# Patient Record
Sex: Male | Born: 1940 | Race: White | Hispanic: No | Marital: Married | Smoking: Never smoker
Health system: Southern US, Community
[De-identification: ages and names within clinical notes are randomized; demographics above are authoritative.]

## PROBLEM LIST (undated history)

## (undated) DIAGNOSIS — E785 Hyperlipidemia, unspecified: Secondary | ICD-10-CM

## (undated) DIAGNOSIS — E669 Obesity, unspecified: Secondary | ICD-10-CM

## (undated) DIAGNOSIS — I2510 Atherosclerotic heart disease of native coronary artery without angina pectoris: Secondary | ICD-10-CM

## (undated) DIAGNOSIS — Z8739 Personal history of other diseases of the musculoskeletal system and connective tissue: Secondary | ICD-10-CM

## (undated) DIAGNOSIS — M4800 Spinal stenosis, site unspecified: Secondary | ICD-10-CM

## (undated) DIAGNOSIS — M722 Plantar fascial fibromatosis: Secondary | ICD-10-CM

## (undated) HISTORY — DX: Atherosclerotic heart disease of native coronary artery without angina pectoris: I25.10

## (undated) HISTORY — DX: Hyperlipidemia, unspecified: E78.5

## (undated) HISTORY — DX: Obesity, unspecified: E66.9

## (undated) HISTORY — DX: Personal history of other diseases of the musculoskeletal system and connective tissue: Z87.39

## (undated) HISTORY — DX: Spinal stenosis, site unspecified: M48.00

## (undated) HISTORY — PX: TONSILLECTOMY: SHX5217

## (undated) HISTORY — DX: Plantar fascial fibromatosis: M72.2

---

## 1997-02-14 HISTORY — PX: CORONARY ARTERY BYPASS GRAFT: SHX141

## 1997-08-14 ENCOUNTER — Ambulatory Visit (HOSPITAL_COMMUNITY): Admission: RE | Admit: 1997-08-14 | Discharge: 1997-08-14 | Payer: Self-pay | Admitting: Cardiology

## 1997-08-25 ENCOUNTER — Inpatient Hospital Stay (HOSPITAL_COMMUNITY): Admission: RE | Admit: 1997-08-25 | Discharge: 1997-08-30 | Payer: Self-pay | Admitting: Surgery

## 1997-09-30 ENCOUNTER — Encounter (HOSPITAL_COMMUNITY): Admission: RE | Admit: 1997-09-30 | Discharge: 1997-12-29 | Payer: Self-pay | Admitting: Cardiology

## 1997-12-30 ENCOUNTER — Encounter (HOSPITAL_COMMUNITY): Admission: RE | Admit: 1997-12-30 | Discharge: 1998-03-30 | Payer: Self-pay | Admitting: Cardiology

## 2004-08-02 ENCOUNTER — Inpatient Hospital Stay (HOSPITAL_BASED_OUTPATIENT_CLINIC_OR_DEPARTMENT_OTHER): Admission: RE | Admit: 2004-08-02 | Discharge: 2004-08-02 | Payer: Self-pay | Admitting: Cardiology

## 2004-08-02 HISTORY — PX: CARDIAC CATHETERIZATION: SHX172

## 2007-03-13 ENCOUNTER — Encounter: Admission: RE | Admit: 2007-03-13 | Discharge: 2007-03-13 | Payer: Self-pay | Admitting: Orthopedic Surgery

## 2007-04-30 ENCOUNTER — Ambulatory Visit (HOSPITAL_COMMUNITY): Admission: RE | Admit: 2007-04-30 | Discharge: 2007-04-30 | Payer: Self-pay | Admitting: Orthopedic Surgery

## 2010-04-29 ENCOUNTER — Ambulatory Visit (INDEPENDENT_AMBULATORY_CARE_PROVIDER_SITE_OTHER): Payer: MEDICARE | Admitting: Cardiology

## 2010-04-29 DIAGNOSIS — I2510 Atherosclerotic heart disease of native coronary artery without angina pectoris: Secondary | ICD-10-CM

## 2010-04-29 DIAGNOSIS — E782 Mixed hyperlipidemia: Secondary | ICD-10-CM

## 2010-04-29 DIAGNOSIS — Z951 Presence of aortocoronary bypass graft: Secondary | ICD-10-CM

## 2010-05-13 ENCOUNTER — Ambulatory Visit (HOSPITAL_COMMUNITY): Payer: MEDICARE | Attending: Cardiology | Admitting: Radiology

## 2010-05-13 VITALS — Ht 69.5 in | Wt 212.0 lb

## 2010-05-13 DIAGNOSIS — I440 Atrioventricular block, first degree: Secondary | ICD-10-CM

## 2010-05-13 DIAGNOSIS — I25810 Atherosclerosis of coronary artery bypass graft(s) without angina pectoris: Secondary | ICD-10-CM

## 2010-05-13 DIAGNOSIS — R0602 Shortness of breath: Secondary | ICD-10-CM

## 2010-05-13 DIAGNOSIS — R079 Chest pain, unspecified: Secondary | ICD-10-CM

## 2010-05-13 DIAGNOSIS — Z951 Presence of aortocoronary bypass graft: Secondary | ICD-10-CM | POA: Insufficient documentation

## 2010-05-13 DIAGNOSIS — I2510 Atherosclerotic heart disease of native coronary artery without angina pectoris: Secondary | ICD-10-CM | POA: Insufficient documentation

## 2010-05-13 MED ORDER — TECHNETIUM TC 99M TETROFOSMIN IV KIT
33.0000 | PACK | Freq: Once | INTRAVENOUS | Status: AC | PRN
  Administered 2010-05-13: 33 via INTRAVENOUS

## 2010-05-13 MED ORDER — TECHNETIUM TC 99M TETROFOSMIN IV KIT
11.0000 | PACK | Freq: Once | INTRAVENOUS | Status: AC | PRN
  Administered 2010-05-13: 11 via INTRAVENOUS

## 2010-05-13 NOTE — Progress Notes (Signed)
[facility] SITE 3 NUCLEAR MED [address] [place] [address] [phone]  Cardiology Nuclear Med Henry Patrick male 09/14/1940   Nuclear Med Background Indication for Stress Test:  Evaluation for Ischemia and Graft Patency History: '99 CABG x 2;'06 Heart Catheterization:Grafts patent,EF60%, residual N/O CAD; and'09 Myocardial Perfusion Study:NL, EF=56% Cardiac Risk Factors: Family History - CAD and Lipids  Symptoms:  Chest Pain with Exertion (last date of chest discomfort yesterday), DOE, Fatigue with Exertion   Nuclear Pre-Procedure Caffeine/Decaff Intake:  7:00pm NPO After: 8:00pm   Lungs:  Clear IV 0.9% NS with Angio Cath:  18g  IV Site: R Antecubital  IV Started by:  Stanton Kidney, EMT-P  Chest Size (in):  42 Cup Size:   NA  Height: 5' 9.5" (1.765 m)  Weight:  212 lb (96.163 kg)  BMI:  Body mass index is 30.86 kg/(m^2). Tech Comments:  NA    Nuclear Med Study 1 or 2 day study: 1 day  Stress Test Type:  Stress  Reading MD: Dietrich Pates, MD  Order Authorizing Provider:  Dr. Vonna Drafts  Resting Radionuclide: Technetium 40m Tetrofosmin  Resting Radionuclide Dose: 11 mCi   Stress Radionuclide:  Technetium 12m Tetrofosmin  Stress Radionuclide Dose: 33 mCi           Stress Protocol Rest HR: 55 Stress HR: 146  Rest BP: 160/84 Stress BP: 214/75  Exercise Time:  7:00 min METS: 8.5  Predicted HR: 149 % of Maximum: 97.99    Predicted Max HR: 149 bpm % Max HR: 97.99 bpm Rate Pressure Product: 56213    Dose of Adenosine:  NA mg Dose of Lexiscan:  NA mg  Dose of Atropine:  NA mg Dose of Dobutamine:  NA mcg/kg/min (at max HR)  Stress Test Technologist: Irean Hong, RN  Nuclear Technologist:  Domenic Polite, CNMT     Rest Procedure:  Myocardial perfusion imaging was performed at rest 45 minutes following the intravenous administration of Technetium 46m Tetrofosmin. Rest ECG: SB, 1st degree heart block with PVC  Stress Procedure:  The  patient exercised for seven minutes, RPE=15. The patient stopped due to DOE and denied any chest pain. There were  ST-T wave changes, and rare PVC,and fusion beat.There was a hypertensive response to exercise.  Technetium 81m Tetrofosmin was injected at peak exercise and myocardial perfusion imaging was performed after a brief delay. Stress ECG: No significant change from baseline ECG  QPS Raw Data Images:  Mild diaphragmatic attenuation; normal left ventricular size. Stress Images:  Normal homogeneous uptake in all areas of the myocardium. Rest Images:  Normal homogeneous uptake in all areas of the myocardium. Subtraction (SDS):  No evidence of ischemia.  Findings Risk Category:  Normal nuclear study. Clinically Abnormal:  No Ischemia:  No Fixed Defect:  No LV Dysfunction:  No Transient Ischemic Dilatation (Normal <1.22):  1.01  Lung/Heart Ratio (Normal <0.45):  .32  Quantitative Gated Spect Images QGS EDV:  98 ml QGS ESV:  39 ml QGS cine images:  Normal wall motion. QGS EF:  60 %  Impression Exercise Capacity:  Fair exercise capacity. BP Response:  Normal blood pressure response. Clinical Symptoms:  No chest pain. ECG Impression:  No significant ST segment change suggestive of ischemia. Comparison with Prior Nuclear Study: Normal compared to report of 2009.  Overall Impression:  Normal stress nuclear study.

## 2010-05-17 ENCOUNTER — Telehealth: Payer: Self-pay | Admitting: *Deleted

## 2010-05-17 NOTE — Telephone Encounter (Signed)
Notified of myoview

## 2010-05-17 NOTE — Telephone Encounter (Signed)
LM w/ STC results

## 2010-05-31 NOTE — Progress Notes (Signed)
COPY ROUTED TO DR.JORDAN.Falecha Clark ° °

## 2010-06-01 ENCOUNTER — Telehealth: Payer: Self-pay | Admitting: *Deleted

## 2010-06-01 NOTE — Telephone Encounter (Signed)
LM w/ nml stress test. Sent to Dr. Eloise Harman

## 2010-06-29 NOTE — Op Note (Signed)
NAME:  Henry Patrick, MCFETRIDGE               ACCOUNT NO.:  [id number]   MEDICAL RECORD NO.:  [id number]          PATIENT TYPE:  AMB   LOCATION:  DAY                          FACILITY:  [facility]   PHYSICIAN:  Marlowe Kays, M.D.  DATE OF BIRTH:  1940-08-22   DATE OF PROCEDURE:  04/30/2007  DATE OF DISCHARGE:                               OPERATIVE REPORT   PREOPERATIVE DIAGNOSIS:  Torn medial meniscus right knee.   POSTOPERATIVE DIAGNOSIS:  1. Torn medial meniscus.  2. 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  3. Chondromalacia of patella.  4. Medial and lateral shelf plica.   OPERATION:  Right knee arthroscopy with:  1. Partial medial meniscectomy.  2. Shaving of medial femoral condyle with removal of loose body.  3. Shaving of patella.  4. Excision of medial and lateral shelf suprapatellar plica.   SURGEON:  Marlowe Kays, MD.   ASSISTANT:  Nurse.   ANESTHESIA:  General.   Because of pain and swelling in his right knee, he is here today.  He  had an  MRI demonstrating the torn medial meniscus.  He is here today,  consequently, for the above-mentioned surgery.   PROCEDURE:  Satisfactory general anesthesia, pneumatic tourniquet  applied, right leg Esmarched out nonsterilely, inflated to 325 mmHg.  Thigh stabilizer to right leg which was prepped with DuraPrep from  stabilizer to ankle and draped in a sterile field.  Ace wrap and knee  support to his left lower extremity, time-out performed.  Superior and  medial saline inflow.  First, through an anterolateral portal in the  medial compartment knee joint was evaluated.  He had a good bit of  synovitis anteromedially which I resected with a 3.5 shaver.  He had  sustained an abrasion injury to the anterior weightbearing surface of  the medial meniscus but no true tear.  The chondromalacia of the medial  femoral condyle was noted including a loose fragment of articular  cartilage in the joint.  I  shaved down the medial femoral condyle until  smooth and in the process removed the loose fragment.  Posteriorly, he  had tears from just proximal to the curve all the way into the  intercondylar area which I trimmed back to a stable rim with a  combination of 3.5 shaver and curved upbite baskets.  Looking at the  medial gutter and suprapatellar area, he had some moderate wear of the  midportion of the patella and medial and lateral plicae.  I sectioned  the plicae with scissors and then resected the remnants with the 3.5  shavers as well as shaving the patella.  I then reversed portals.  There  was a little synovitis at the anterolateral joint which I resected.  The  remainder of the lateral joint looked completely normal.  The knee joint  was irrigated until clear and all fluid possible removed.  The three  entry portals were closed with #4-0 nylon.  I injected 20 mL 0.5%  Marcaine with adrenaline and 4 mg of morphine through the  inflow apparatus  which was removed and this port was closed with #4-0  nylon as well.  Betadine, Adaptic, dry sterile dressing were applied.  Tourniquet was released.  He tolerated the procedure well, was taken to  Recovery in satisfactory condition with no complications.           ______________________________  Marlowe Kays, M.D.     JA/MEDQ  D:  04/30/2007  T:  04/30/2007  Job:  [id number]

## 2010-07-02 NOTE — Cardiovascular Report (Signed)
NAMERANDAL, GOENS               ACCOUNT NO.:  [id number]   MEDICAL RECORD NO.:  [id number]          PATIENT TYPE:  OIB   LOCATION:  6501                         FACILITY:  [facility]   PHYSICIAN:  Peter M. Swaziland, M.D.  DATE OF BIRTH:  [birth year]   DATE OF PROCEDURE:  08/02/2004  DATE OF DISCHARGE:                              CARDIAC CATHETERIZATION   INDICATIONS FOR PROCEDURE:  A 70 year old white male status post coronary  artery bypass surgery in 1999. He has had a history of hypercholesterolemia.  He presents with atypical chest symptoms. Subsequent stress Cardiolite study  is suggestive of mid anterior wall ischemia.   PROCEDURES:  Left heart catheterization, coronary and left ventricular  angiography, saphenous vein graft angiography x1, LIMA graft angiography.   EQUIPMENT USED:  Four-French 4 cm right and left Judkins catheter, 4-French  pigtail catheter, 4-French arterial sheath.   MEDICATIONS:  Local anesthesia 1% Xylocaine.   ACCESS:  Via the right femoral artery using standard Seldinger technique.   COMPLICATIONS:  None.   CONTRAST:  Omnipaque 140 mL.   RESULTS:  1.  Hemodynamic data: Aortic pressure is 135/68 with mean of 95 mmHg, left      ventricular pressure 136 with EDP of 17 mmHg.  2.  Angiographic data:      1.  The left coronary arises and distributes normally. The left main          coronary artery is long. There is a 30% ostial stenosis also          followed in the proximal left main.      2.  The left anterior descending artery was a large vessel. It has a          focal 90% stenosis proximally followed by a short aneurysmal segment          and diffuse 50% stenosis in the mid vessel. Both diagonals are          patent.      3.  Left circumflex coronary artery is a small vessel which gives rise          to a single marginal vessel. The circumflex is diffusely diseased up          to 90% prior to the marginal vessel.      4.  The right coronary arises  and distributes in a dominant fashion. It          is a very large vessel. It has 20-30% narrowing in the proximal to          mid vessel. The remainder of the vessel is without significant          disease.      5.  The saphenous vein graft to the first marginal branch is widely          patent with excellent runoff.      6.  The LIMA graft to the LAD is widely patent. There is excellent          runoff.      7.  Left ventricular angiography demonstrates  normal left ventricular          size. There is slight outpouching of the mid anterior wall but this          area demonstrates normal contractility and overall normal left          ventricular function is normal with ejection fraction estimated at          60%. There is no mitral regurgitation or prolapse.   FINAL INTERPRETATION:  1.  Severe two-vessel obstructive atherosclerotic coronary artery disease.  2.  Normal left ventricular function.  3.  Patent grafts including saphenous vein graft to first obtuse marginal      vessel and patent LIMA graft to the LAD.       PMJ/MEDQ  D:  08/02/2004  T:  08/02/2004  Job:  [id number]   cc:   Barry Dienes. Eloise Harman, M.D.  [address]  [place]  [place] [address]  Fax: [phone]

## 2010-07-02 NOTE — H&P (Signed)
NAME:  Henry, Patrick               ACCOUNT NO.:  [id number]   MEDICAL RECORD NO.:  [id number]         PATIENT TYPE:  JCAR   LOCATION:                               FACILITY:  [facility]   PHYSICIAN:  Peter M. Swaziland, M.D.  DATE OF BIRTH:  [birth year]   DATE OF ADMISSION:  08/02/2004  DATE OF DISCHARGE:                                HISTORY & PHYSICAL   HISTORY OF PRESENT ILLNESS:  Henry Patrick is a 70 year old white male who has  know history of coronary artery disease.  He underwent coronary artery  bypass surgery x 2 in 1999 by Dr. Laneta Simmers.  At that time he presented with  increasing angina, had an abnormal stress test.  Cardiac catheterization  demonstrated 60% stenosis in the left main coronary artery and a long severe  segmental stenosis in the proximal LAD.  He underwent bypass with LIMA graft  to the LAD and a vein graft to the obtuse marginal vessel.  He has done well  since then.  He recently presented with symptoms of pain in his mid back  with walking associated with occasional chest pain.  Because of these  symptoms, he underwent a stress Cardiolite study.  He was able to walk for 9  minutes on the Bruce protocol.  He had no chest pain.  He had 1 to 1.5 mm of  ST segment depression in the inferolateral leads suggestive of ischemia.  Subsequent Cardiolite images demonstrated a reversible defect in the mid to  distal anterior wall consistent with ischemic and normal left ventricular  function with ejection fraction of 66%.  Based on these findings, further  evaluation with cardiac catheterization was recommended.   PAST MEDICAL HISTORY:  1.  Atherosclerotic CAD status post CABG as noted.  2.  Obesity.  3.  History of dyslipidemia.  4.  History of arthritis.   PAST SURGICAL HISTORY:  1.  Tonsillectomy.  2.  Coronary artery bypass surgery.   ALLERGIES:  No known allergies.   CURRENT MEDICATIONS:  1.  Aspirin daily.  2.  Multivitamins daily.  3.  Lycine 500 mg 2 tablets  daily.  4.  Lipitor 20 mg per day.  5.  Fish oil daily.  6.  Aleve once a day.   SOCIAL HISTORY:  The patient has worked as an Airline pilot.  He denies tobacco  or alcohol use.  He is married and has no children.   FAMILY HISTORY:  Father died at age 33.  He had a myocardial infarction at  age 32.  Mother died at age 26 with myocardial infarction.  One brother has  a history of hypertension, and two sisters are alive and well.  One sister  died with cirrhosis.   REVIEW OF SYSTEMS:  The patient denies any palpitations, significant  shortness of breath, or edema.  He has had no recent change in bowel or  bladder habits.  No history of TIA or stroke.  No claudication symptoms.  Other Review of Systems are negative.   PHYSICAL EXAMINATION:  GENERAL:  The patient is a pleasant white male in no  distress.  VITAL SIGNS:  Weight 201.  Blood pressure 130/78, pulse 68 and regular,  respirations normal.  HEENT:  Normocephalic and atraumatic.  Pupils equal, round, and reactive to  light and accommodation.  Extraocular movements are full.  Oropharynx is  clear.  NECK:  Supple without JVD, adenopathy, thyromegaly, or bruits.  LUNGS:  Clear to auscultation and percussion.  CARDIAC:  Regular rate and rhythm.  Normal S1 and S2 without gallop, murmur,  rub, or click.  ABDOMEN:  Obese, soft, and nontender.  There are no masses or bruits.  EXTREMITIES:  Femoral and pedal pulses are 2+ and symmetric.  He has no  edema.  NEUROLOGIC:  Exam is nonfocal.   LABORATORY DATA:  Resting ECG shows normal sinus rhythm with nonspecific ST-  T wave abnormalities.   IMPRESSION:  1.  Atherosclerotic coronary artery disease status post coronary artery      bypass graft surgery in 1997, now with atypical chest pain.  Stress      Cardiolite study was abnormal showing evidence of anterior wall      ischemia.  2.  Dyslipidemia.  3.  Obesity.   PLAN:  Will admit for diagnostic cardiac catheterization.            ______________________________  Peter M. Swaziland, M.D.     PMJ/MEDQ  D:  07/26/2004  T:  07/26/2004  Job:  [id number]   cc:   Barry Dienes. Eloise Harman, M.D.  [address]  [place]  [place] [address]  Fax: [phone]

## 2010-11-08 LAB — HEMOGLOBIN AND HEMATOCRIT, BLOOD
HCT: 39.6
Hemoglobin: 13.2

## 2010-11-08 LAB — BASIC METABOLIC PANEL
CO2: 27
Chloride: 108
Potassium: 4.6
Sodium: 145

## 2011-03-20 ENCOUNTER — Encounter (INDEPENDENT_AMBULATORY_CARE_PROVIDER_SITE_OTHER): Payer: Medicare Other | Admitting: Ophthalmology

## 2011-03-20 DIAGNOSIS — H1030 Unspecified acute conjunctivitis, unspecified eye: Secondary | ICD-10-CM

## 2011-06-17 ENCOUNTER — Encounter: Payer: Self-pay | Admitting: Cardiology

## 2011-06-17 ENCOUNTER — Ambulatory Visit (INDEPENDENT_AMBULATORY_CARE_PROVIDER_SITE_OTHER): Payer: Medicare Other | Admitting: Cardiology

## 2011-06-17 VITALS — BP 136/76 | HR 61 | Ht 69.0 in | Wt 215.0 lb

## 2011-06-17 DIAGNOSIS — I2510 Atherosclerotic heart disease of native coronary artery without angina pectoris: Secondary | ICD-10-CM

## 2011-06-17 DIAGNOSIS — Z951 Presence of aortocoronary bypass graft: Secondary | ICD-10-CM | POA: Insufficient documentation

## 2011-06-17 DIAGNOSIS — I259 Chronic ischemic heart disease, unspecified: Secondary | ICD-10-CM

## 2011-06-17 DIAGNOSIS — E669 Obesity, unspecified: Secondary | ICD-10-CM

## 2011-06-17 DIAGNOSIS — E785 Hyperlipidemia, unspecified: Secondary | ICD-10-CM

## 2011-06-17 NOTE — Assessment & Plan Note (Signed)
He had a stress Myoview study in April 2012 which was normal. Ejection fraction was 60%. He remains asymptomatic. We will continue with risk factor modification including aspirin and statin therapy. I will followup again in one year.

## 2011-06-17 NOTE — Progress Notes (Signed)
   Henry Patrick Date of Birth: February 07, 1941 Medical Record #[id number]  History of Present Illness: Henry Patrick is seen today for yearly followup. He has a history of coronary disease and is status post CABG in 1997. This included an LIMA graft to the LAD and a saphenous vein graft to the obtuse marginal vessel. He also has a history of dyslipidemia and obesity. He reports that he has done well over the past year. He denies any chest pain or shortness of breath. His only complaint is that he feels fatigued a lot and tired. He denies any insomnia. He has no history of apnea or snoring. He continues to struggle with his weight.  Current Outpatient Prescriptions on File Prior to Visit  Medication Sig Dispense Refill  . atorvastatin (LIPITOR) 20 MG tablet Take 40 mg by mouth daily. 1/2 daily      . niacin (NIASPAN) 500 MG CR tablet Take 500 mg by mouth at bedtime.        No Known Allergies  Past Medical History  Diagnosis Date  . Coronary artery disease     Atherosclerotic CAD  . Obesity   . Dyslipidemia     History of dyslipidemia  . History of arthritis     Past Surgical History  Procedure Date  . Coronary artery bypass graft 1999    x2 by Dr. Armida [email or web address]  . Tonsillectomy   . Cardiac catheterization 08/02/2004    ejection fraction estimated 60%    History  Smoking status  . Never Smoker   Smokeless tobacco  . Not on file    History  Alcohol Use No    Family History  Problem Relation Age of Onset  . Heart attack Mother   . Heart attack Father 63  . Hypertension Brother   . Cirrhosis Sister     Review of Systems: The review of systems is positive for fatigue.  All other systems were reviewed and are negative.  Physical Exam: BP 136/76  Pulse 61  Ht 5\' 9"  (1.753 m)  Wt 215 lb (97.523 kg)  BMI 31.75 kg/m2 He is an obese white male in no acute distress. HEENT exam is unremarkable. He has no JVD or bruits. Lungs are clear. Cardiac exam reveals a regular  rate and rhythm without gallop, murmur, or click. Abdomen is obese, soft, and nontender. He has no edema. Pedal pulses are 2+ and symmetric. He is alert and oriented x3. Cranial nerves II through XII are intact. LABORATORY DATA: ECG today shows normal sinus rhythm with first degree AV block. It is otherwise normal.  Assessment / Plan:

## 2011-06-17 NOTE — Patient Instructions (Signed)
Continue your current therapy  I will see you again in 1 year.   

## 2011-06-29 ENCOUNTER — Other Ambulatory Visit: Payer: Self-pay | Admitting: Cardiology

## 2011-06-29 MED ORDER — ATORVASTATIN CALCIUM 20 MG PO TABS: 40.0000 mg | ORAL_TABLET | Freq: Every day | ORAL | Status: DC

## 2011-07-01 ENCOUNTER — Other Ambulatory Visit: Payer: Self-pay | Admitting: Cardiology

## 2011-07-04 ENCOUNTER — Other Ambulatory Visit: Payer: Self-pay | Admitting: *Deleted

## 2011-07-04 MED ORDER — ATORVASTATIN CALCIUM 40 MG PO TABS: 40.0000 mg | ORAL_TABLET | Freq: Every day | ORAL | Status: DC

## 2011-07-04 NOTE — Telephone Encounter (Signed)
Fax Received. Refill Completed. Henry Patrick (R.M.A)   

## 2011-07-06 NOTE — Telephone Encounter (Signed)
New msg Pt wants to talk about rx for lipitor- he doesn't want generic. Please call

## 2011-07-14 ENCOUNTER — Telehealth: Payer: Self-pay | Admitting: Cardiology

## 2011-07-14 NOTE — Telephone Encounter (Signed)
Patient called no answer, unable to leave a message.

## 2011-07-14 NOTE — Telephone Encounter (Signed)
Pt called back again Pt wants 90 day supply with 1 yr refill

## 2011-07-14 NOTE — Telephone Encounter (Signed)
New msg Pt wants to talk to you about his meds. He wants refill of lipitor and doesn't want generic- 40mg  dispense as written. He is upset about getting his meds he only has 4 pills left

## 2011-07-14 NOTE — Telephone Encounter (Signed)
Patient called no answer.LMTC. 

## 2011-07-15 MED ORDER — ATORVASTATIN CALCIUM 40 MG PO TABS: ORAL_TABLET | ORAL | Status: DC

## 2011-07-15 MED ORDER — ATORVASTATIN CALCIUM 20 MG PO TABS: 40.0000 mg | ORAL_TABLET | Freq: Every day | ORAL | Status: DC

## 2011-07-15 NOTE — Telephone Encounter (Signed)
[facility] already called today 07/15/11.

## 2011-07-15 NOTE — Telephone Encounter (Signed)
Patient called stated he needed refill sent to mail pharmacy for brand name lipitor 40 mg every night.States needs to say dispense as written.Prescription sent to [facility].

## 2011-07-18 ENCOUNTER — Telehealth: Payer: Self-pay

## 2011-07-18 NOTE — Telephone Encounter (Signed)
Spoke to pharmacist at [facility] was told patient takes lipitor 40 mgs every night.Patient request brand name only # 90 refills x 3.

## 2013-03-12 ENCOUNTER — Ambulatory Visit (INDEPENDENT_AMBULATORY_CARE_PROVIDER_SITE_OTHER): Payer: Medicare HMO

## 2013-03-12 ENCOUNTER — Ambulatory Visit (INDEPENDENT_AMBULATORY_CARE_PROVIDER_SITE_OTHER): Payer: Medicare HMO | Admitting: Podiatry

## 2013-03-12 ENCOUNTER — Encounter: Payer: Self-pay | Admitting: Podiatry

## 2013-03-12 VITALS — BP 133/79 | HR 64 | Resp 16 | Ht 69.0 in | Wt 205.0 lb

## 2013-03-12 DIAGNOSIS — M79673 Pain in unspecified foot: Secondary | ICD-10-CM

## 2013-03-12 DIAGNOSIS — M79609 Pain in unspecified limb: Secondary | ICD-10-CM

## 2013-03-12 DIAGNOSIS — M7660 Achilles tendinitis, unspecified leg: Secondary | ICD-10-CM

## 2013-03-12 DIAGNOSIS — M722 Plantar fascial fibromatosis: Secondary | ICD-10-CM

## 2013-03-12 MED ORDER — METHYLPREDNISOLONE (PAK) 4 MG PO TABS: ORAL_TABLET | ORAL | Status: DC

## 2013-03-12 NOTE — Progress Notes (Signed)
   Subjective:    Patient ID: Henry Patrick, male    DOB: 1941-01-13, 73 y.o.   MRN: [id number]  HPI Comments: Both of my feet hurt on the bottom especially my heels, the right foot is much worse. It hurts to walk and during and after sitting it hurts. They both are numb, burns, and tingly and pain. All this has been going on for the last 2 -3 years and this year is a lot worse. i take aleve and aspirin for my feet but it doesn't help much.   Foot Pain Associated symptoms include numbness.      Review of Systems  Constitutional: Negative.   HENT: Negative.   Eyes: Negative.   Respiratory: Negative.   Cardiovascular: Positive for leg swelling.       Calf pain when walking  Gastrointestinal: Negative.   Endocrine: Negative.   Genitourinary: Negative.   Musculoskeletal: Positive for back pain.       Joint pain Difficulty walking Muscle pain  Skin: Negative.   Allergic/Immunologic: Negative.   Neurological: Positive for numbness.  Hematological: Negative.   Psychiatric/Behavioral: Negative.        Objective:   Physical Exam: I have reviewed his past medical history medications allergies surgeries social history. Vital signs are stable he is alert and oriented x3. Pulses are palpable bilateral. Neurologic sensorium is intact bilateral degenerative flexor intact bilateral muscle strength is 5 over 5 dorsiflexors plantar flexors inverters everters all intrinsic musculature is intact. Orthopedic evaluation demonstrates pain on palpation medial continued tubercles bilateral. Radiographic evaluation does demonstrate plantar distally oriented calcaneal heel spurs with soft tissue increase in density at the plantar fascial calcaneal insertion sites bilaterally. He also has thickening of the tendo Achilles which are painful palpation as well as right greater than left.        Assessment & Plan:  Assessment: Pain in limb secondary to tendo Achilles tendinitis and plantar fasciitis with  compensatory syndrome bilateral. Right greater than left.  Plan: We discussed the etiology pathology conservative versus surgical therapies at this point a night splint was dispensed we injected the plantar fascial area of both heels Sterapred Dosepak was dispensed to be followed by Mobic. Discussed appropriate shoe gear stretching exercises and ice therapy will followup with him in one month.

## 2013-03-14 ENCOUNTER — Ambulatory Visit: Payer: Medicare Other | Admitting: Cardiology

## 2013-04-04 ENCOUNTER — Ambulatory Visit (INDEPENDENT_AMBULATORY_CARE_PROVIDER_SITE_OTHER): Payer: Medicare HMO | Admitting: Cardiology

## 2013-04-04 ENCOUNTER — Encounter: Payer: Self-pay | Admitting: Cardiology

## 2013-04-04 VITALS — BP 150/78 | HR 66 | Ht 69.0 in | Wt 213.0 lb

## 2013-04-04 DIAGNOSIS — I2510 Atherosclerotic heart disease of native coronary artery without angina pectoris: Secondary | ICD-10-CM

## 2013-04-04 DIAGNOSIS — Z951 Presence of aortocoronary bypass graft: Secondary | ICD-10-CM

## 2013-04-04 DIAGNOSIS — E785 Hyperlipidemia, unspecified: Secondary | ICD-10-CM

## 2013-04-04 NOTE — Patient Instructions (Addendum)
Stop taking Niacin.  Keep your weight down.  When you are able resume exercise

## 2013-04-04 NOTE — Progress Notes (Signed)
   Henry RanaWilbert A Patrick Date of Birth: [birth year] Medical Record #[id number]  History of Present Illness: Henry Patrick is seen today for yearly followup. He has a history of coronary disease and is status post CABG in 1997. This included an LIMA graft to the LAD and a saphenous vein graft to the obtuse marginal vessel. He also has a history of dyslipidemia and obesity. He reports that he has done well over the past year. He denies any chest pain or shortness of breath. He has had problems with his back and feet that has really affected his ability to exercise. He has spinal stenosis and plantar fasciitis. He received injections for both with some improvement.  Current Outpatient Prescriptions on File Prior to Visit  Medication Sig Dispense Refill  . aspirin 325 MG tablet Take 325 mg by mouth daily.      Marland Kitchen. atorvastatin (LIPITOR) 20 MG tablet Take 2 tablets (40 mg total) by mouth daily. 1/2 daily  90 tablet  3  . Lysine 500 MG CAPS Take by mouth as directed.      . Multiple Vitamin (MULTI-VITAMIN PO) Take by mouth daily.      . Naproxen Sodium (ALEVE PO) Take by mouth as needed.      . Omega-3 Fatty Acids (FISH OIL PO) Take by mouth daily.       No current facility-administered medications on file prior to visit.    No Known Allergies  Past Medical History  Diagnosis Date  . Coronary artery disease     Atherosclerotic CAD  . Obesity   . Dyslipidemia     History of dyslipidemia  . History of arthritis   . Spinal stenosis   . Plantar fasciitis     Past Surgical History  Procedure Laterality Date  . Coronary artery bypass graft  1999    x2 by Dr. Armida SansBartle,[email or web address]  . Tonsillectomy    . Cardiac catheterization  08/02/2004    ejection fraction estimated 60%    History  Smoking status  . Never Smoker   Smokeless tobacco  . Not on file    History  Alcohol Use No    Family History  Problem Relation Age of Onset  . Heart attack Mother   . Heart attack Father [id number]  .  Hypertension Brother   . Cirrhosis Sister     Review of Systems: The review of systems is positive for fatigue.  All other systems were reviewed and are negative.  Physical Exam: BP 150/78  Pulse 66  Ht 5\' 9"  (1.753 m)  Wt 213 lb (96.616 kg)  BMI 31.44 kg/m2 He is an obese white male in no acute distress. HEENT exam is unremarkable. He has no JVD or bruits. Lungs are clear. Cardiac exam reveals a regular rate and rhythm without gallop, murmur, or click. Abdomen is obese, soft, and nontender. He has no edema. Pedal pulses are 2+ and symmetric. He is alert and oriented x3. Cranial nerves II through XII are intact.  LABORATORY DATA: ECG today shows normal sinus rhythm with first degree AV block. It is otherwise normal. Rate 66 bpm  Assessment / Plan: 1. CAD s/p CABG in 1997. Normal Myoview in 2012. He remains asymptomatic. Continue ASA and statin therapy.  2. Hyperlipidemia. Continue statin therapy. Based on AIM high trial I have recommended stopping Niaspan due to lack of outcome benefit.

## 2013-04-09 ENCOUNTER — Ambulatory Visit (INDEPENDENT_AMBULATORY_CARE_PROVIDER_SITE_OTHER): Payer: Medicare HMO | Admitting: Podiatry

## 2013-04-09 ENCOUNTER — Encounter: Payer: Self-pay | Admitting: Podiatry

## 2013-04-09 ENCOUNTER — Ambulatory Visit: Payer: Medicare HMO | Admitting: Podiatry

## 2013-04-09 VITALS — BP 139/68 | HR 77 | Resp 16

## 2013-04-09 DIAGNOSIS — M722 Plantar fascial fibromatosis: Secondary | ICD-10-CM

## 2013-04-09 DIAGNOSIS — M7660 Achilles tendinitis, unspecified leg: Secondary | ICD-10-CM

## 2013-04-09 NOTE — Progress Notes (Signed)
He presents today for followup of his bilateral plantar fasciitis and tendo Achilles tendinitis right foot. He does bring his Cam Walker within today. He states that he is almost 100% better as far as the plantar fasciitis goes.  Objective: Vital signs are stable he is alert and oriented x3. He has no pain on palpation medial continued tubercle of the left heel. The right heel is still tender on palpation. He has pain on palpation the lateral aspect of the tendo Achilles insertion of the right calcaneus.  Assessment: Insertional Achilles tendinitis lateral calcaneus right. Plantar fasciitis right.  Plan: Discussed etiology pathology conservative versus surgical therapies at this point I injected his right heel plantarly with Kenalog and local anesthetic I injected the posterior lateral subcutaneous area superficial to the Achilles tendon with 2 mg of dexamethasone.  He start wearing his Cam Walker during the day and his night splint at nighttime he will continue icing the area and I will followup with him in one month to 6 weeks.

## 2013-04-18 ENCOUNTER — Ambulatory Visit: Payer: Medicare HMO | Admitting: Podiatry

## 2013-05-07 ENCOUNTER — Ambulatory Visit (INDEPENDENT_AMBULATORY_CARE_PROVIDER_SITE_OTHER): Payer: Medicare HMO | Admitting: Podiatry

## 2013-05-07 ENCOUNTER — Encounter: Payer: Self-pay | Admitting: Podiatry

## 2013-05-07 VITALS — BP 122/73 | HR 70 | Resp 18

## 2013-05-07 DIAGNOSIS — M7660 Achilles tendinitis, unspecified leg: Secondary | ICD-10-CM

## 2013-05-07 NOTE — Progress Notes (Signed)
It is about the same as last time. Better but not well.  The right foot is the worse.  Objective: Pulses palpable bilaterally. No pain on palpation of the bilateral plantar heel.  Pain on palpation of the posterior lateral process of the calcaneus and anterior achilles region just superior to the calcaneus.  Assessment: Resolving plantar fasciitis bilateral foot.  80% improvement on achilles tendinitis right.  Plan:  Injected anterior space of achilles today with kenalog and local anesthetic. Continue current medications and conservative treatment plan.

## 2013-05-09 ENCOUNTER — Ambulatory Visit: Payer: Medicare HMO | Admitting: Podiatry

## 2013-06-18 ENCOUNTER — Encounter: Payer: Self-pay | Admitting: Podiatry

## 2013-06-18 ENCOUNTER — Ambulatory Visit (INDEPENDENT_AMBULATORY_CARE_PROVIDER_SITE_OTHER): Payer: Medicare HMO | Admitting: Podiatry

## 2013-06-18 VITALS — BP 132/69 | HR 86 | Resp 12

## 2013-06-18 DIAGNOSIS — M7660 Achilles tendinitis, unspecified leg: Secondary | ICD-10-CM

## 2013-06-18 NOTE — Progress Notes (Signed)
He presents today with his wife with a chief complaint of chronic posterior heel pain right. We had multiple injections to this area and we have only been able to calm down the plantar fasciitis. The tendo Achilles insertion site of the right heel is most painful for him at this point. It is limiting his daily activities.  Objective: Pulses are palpable bilateral. He has pain on palpation of the posterior tendo Achilles insertion site of the calcaneus.  Assessment: Tendo Achilles tendinitis with resolving plantar fasciitis.  Plan: He was scan today for some orthotics. We discussed the necessity for physical therapy which she declined.

## 2013-07-15 ENCOUNTER — Ambulatory Visit (INDEPENDENT_AMBULATORY_CARE_PROVIDER_SITE_OTHER): Payer: Medicare HMO | Admitting: *Deleted

## 2013-07-15 DIAGNOSIS — M722 Plantar fascial fibromatosis: Secondary | ICD-10-CM

## 2013-07-15 NOTE — Progress Notes (Signed)
   Subjective:    Patient ID: Henry Patrick, male    DOB: Nov 29, 1940, 73 y.o.   MRN: [id number]  HPI PICK UP ORTHOTICS AND GIVEN INSTRUCTION.   Review of Systems     Objective:   Physical Exam        Assessment & Plan:

## 2014-06-20 ENCOUNTER — Ambulatory Visit (INDEPENDENT_AMBULATORY_CARE_PROVIDER_SITE_OTHER): Payer: PPO | Admitting: Cardiology

## 2014-06-20 ENCOUNTER — Encounter: Payer: Self-pay | Admitting: Cardiology

## 2014-06-20 VITALS — BP 142/74 | HR 62 | Ht 69.0 in | Wt 217.7 lb

## 2014-06-20 DIAGNOSIS — I25810 Atherosclerosis of coronary artery bypass graft(s) without angina pectoris: Secondary | ICD-10-CM | POA: Diagnosis not present

## 2014-06-20 DIAGNOSIS — Z951 Presence of aortocoronary bypass graft: Secondary | ICD-10-CM

## 2014-06-20 DIAGNOSIS — E785 Hyperlipidemia, unspecified: Secondary | ICD-10-CM

## 2014-06-20 NOTE — Patient Instructions (Signed)
Continue same medications.   Your physician wants you to follow-up in: 1 year.  You will receive a reminder letter in the mail two months in advance. If you don't receive a letter, please call our office to schedule the follow-up appointment.  

## 2014-06-20 NOTE — Addendum Note (Signed)
Addended by: Meda KlinefelterPUGH, Yobana Culliton JOHNSON D on: 06/20/2014 01:17 PM   Modules accepted: Orders

## 2014-06-20 NOTE — Progress Notes (Signed)
   [name] Date of Birth: 07/04/1940 Medical Record #[id number]  History of Present Illness: [name] is seen today for follow up CAD. He has a history of coronary disease and is status post CABG in 1999. This included an LIMA graft to the LAD and a saphenous vein graft to the obtuse marginal vessel. Cath in 2006 showed grafts were patent. Myoview study in April 2012 was normal. He also has a history of dyslipidemia and obesity. He reports that he has done well over the past year. He denies any chest pain or shortness of breath.  He has spinal stenosis and heel spurs. 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 He still walks 2 miles/day and mows 6 of his neighbors yards.   Current Outpatient Prescriptions on File Prior to Visit  Medication Sig Dispense Refill  . aspirin 325 MG tablet Take 325 mg by mouth daily.    Marland Kitchen. atorvastatin (LIPITOR) 20 MG tablet Take 2 tablets (40 mg total) by mouth daily. 1/2 daily (Patient taking differently: Take 20 mg by mouth daily. 1/2 daily) 90 tablet 3  . Multiple Vitamin (MULTI-VITAMIN PO) Take by mouth daily.    . Naproxen Sodium (ALEVE PO) Take by mouth as needed.    . Omega-3 Fatty Acids (FISH OIL PO) Take by mouth daily.     No current facility-administered medications on file prior to visit.    No Known Allergies  Past Medical History  Diagnosis Date  . Coronary artery disease     Atherosclerotic CAD  . Obesity   . Dyslipidemia     History of dyslipidemia  . History of arthritis   . Spinal stenosis   . Plantar fasciitis     Past Surgical History  Procedure Laterality Date  . Coronary artery bypass graft  1999    x2 by Dr. Armida SansBartle,[email or web address]  . Tonsillectomy    . Cardiac catheterization  08/02/2004    ejection fraction estimated 60%    History  Smoking status  . Never Smoker   Smokeless tobacco  . Not on file    History  Alcohol Use No    Family History  Problem  Relation Age of Onset  . Heart attack Mother   . Heart attack Father [id number]  . Hypertension Brother   . Cirrhosis Sister     Review of Systems: As noted in HPI.  All other systems were reviewed and are negative.  Physical Exam: BP 142/74 mmHg  Pulse 62  Ht 5\' 9"  (1.753 m)  Wt 217 lb 11.2 oz (98.748 kg)  BMI 32.13 kg/m2 He is an obese white male in no acute distress. HEENT exam is unremarkable. He has no JVD or bruits. Lungs are clear. Cardiac exam reveals a regular rate and rhythm without gallop, murmur, or click. Abdomen is obese, soft, and nontender. He has no edema. Pedal pulses are 2+ and symmetric. He is alert and oriented x3. Cranial nerves II through XII are intact.  LABORATORY DATA: ECG today shows normal sinus rhythm with first degree AV block. It is otherwise normal. Rate 62 bpm. I have personally reviewed and interpreted this study.   Assessment / Plan: 1. CAD s/p CABG in 1997. Normal Myoview in 2012. He remains asymptomatic. Continue ASA and statin therapy. If he is being considered for spinal stenosis I would recommend a Lexiscan Myoview prior to assess cardiac risk.  2. Hyperlipidemia. Continue statin therapy.

## 2015-01-20 ENCOUNTER — Encounter: Payer: Self-pay | Admitting: Internal Medicine

## 2015-02-19 DIAGNOSIS — R921 Mammographic calcification found on diagnostic imaging of breast: Secondary | ICD-10-CM | POA: Diagnosis not present

## 2015-02-19 DIAGNOSIS — K921 Melena: Secondary | ICD-10-CM | POA: Diagnosis not present

## 2015-02-19 DIAGNOSIS — K5730 Diverticulosis of large intestine without perforation or abscess without bleeding: Secondary | ICD-10-CM | POA: Diagnosis not present

## 2015-08-14 ENCOUNTER — Encounter: Payer: Self-pay | Admitting: Cardiology

## 2015-08-14 ENCOUNTER — Ambulatory Visit (INDEPENDENT_AMBULATORY_CARE_PROVIDER_SITE_OTHER): Payer: PPO | Admitting: Cardiology

## 2015-08-14 VITALS — BP 144/76 | HR 65 | Ht 69.0 in | Wt 214.8 lb

## 2015-08-14 DIAGNOSIS — E785 Hyperlipidemia, unspecified: Secondary | ICD-10-CM | POA: Diagnosis not present

## 2015-08-14 DIAGNOSIS — I25708 Atherosclerosis of coronary artery bypass graft(s), unspecified, with other forms of angina pectoris: Secondary | ICD-10-CM | POA: Diagnosis not present

## 2015-08-14 DIAGNOSIS — E669 Obesity, unspecified: Secondary | ICD-10-CM | POA: Diagnosis not present

## 2015-08-14 MED ORDER — ASPIRIN EC 81 MG PO TBEC: 81.0000 mg | DELAYED_RELEASE_TABLET | Freq: Every day | ORAL | Status: AC

## 2015-08-14 NOTE — Patient Instructions (Signed)
Reduce aspirin to 81 mg daily  We will schedule you for a nuclear stress test

## 2015-08-14 NOTE — Progress Notes (Signed)
   Ethel RanaWilbert A Loppnow Date of Birth: April 10, 1940 Medical Record #[id number]  History of Present Illness: Mr. Karl BalesRoyal is seen today for follow up CAD. He has a history of coronary disease and is status post CABG in 1999. This included an LIMA graft to the LAD and a saphenous vein graft to the obtuse marginal vessel. Cath in 2006 showed grafts were patent. Myoview study in April 2012 was normal. He also has a history of dyslipidemia and obesity. He reports that he has done well over the past year. He has ihfrequent chest pain.  He has spinal stenosis and heel spurs. 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 He still walks in the afternoon and mows 6  yards.   Current Outpatient Prescriptions on File Prior to Visit  Medication Sig Dispense Refill  . aspirin 325 MG tablet Take 325 mg by mouth daily.    Marland Kitchen. atorvastatin (LIPITOR) 20 MG tablet Take 2 tablets (40 mg total) by mouth daily. 1/2 daily (Patient taking differently: Take 20 mg by mouth daily. 1/2 daily) 90 tablet 3  . Multiple Vitamin (MULTI-VITAMIN PO) Take by mouth daily.    . Naproxen Sodium (ALEVE PO) Take by mouth as needed.    . Omega-3 Fatty Acids (FISH OIL PO) Take by mouth daily.     No current facility-administered medications on file prior to visit.    No Known Allergies  Past Medical History  Diagnosis Date  . Coronary artery disease     Atherosclerotic CAD  . Obesity   . Dyslipidemia     History of dyslipidemia  . History of arthritis   . Spinal stenosis   . Plantar fasciitis     Past Surgical History  Procedure Laterality Date  . Coronary artery bypass graft  1999    x2 by Dr. Armida SansBartle,[email or web address]  . Tonsillectomy    . Cardiac catheterization  08/02/2004    ejection fraction estimated 60%    History  Smoking status  . Never Smoker   Smokeless tobacco  . Not on file    History  Alcohol Use No    Family History  Problem Relation Age of Onset   . Heart attack Mother   . Heart attack Father [id number]  . Hypertension Brother   . Cirrhosis Sister     Review of Systems: As noted in HPI.  All other systems were reviewed and are negative.  Physical Exam: There were no vitals taken for this visit. He is an obese white male in no acute distress. HEENT exam is unremarkable. He has no JVD or bruits. Lungs are clear. Cardiac exam reveals a regular rate and rhythm without gallop, murmur, or click. Abdomen is obese, soft, and nontender. He has no edema. Pedal pulses are 2+ and symmetric. He is alert and oriented x3. Cranial nerves II through XII are intact.  LABORATORY DATA: ECG today shows normal sinus rhythm with first degree AV block. It is otherwise normal. Rate 65 bpm. I have personally reviewed and interpreted this study.   Assessment / Plan: 1. CAD s/p CABG in 1997. Normal Myoview in 2012. He remains asymptomatic. Continue ASA and statin therapy. Reduce ASA to 81 mg daily. Recommend follow up nuclear stress test  2. Hyperlipidemia. Continue statin therapy. Labs followed by primary care.

## 2015-08-19 ENCOUNTER — Telehealth (HOSPITAL_COMMUNITY): Payer: Self-pay

## 2015-08-19 NOTE — Telephone Encounter (Signed)
Encounter complete. 

## 2015-08-21 ENCOUNTER — Encounter: Payer: Self-pay | Admitting: Podiatry

## 2015-08-21 ENCOUNTER — Ambulatory Visit (HOSPITAL_COMMUNITY)
Admission: RE | Admit: 2015-08-21 | Discharge: 2015-08-21 | Disposition: A | Discharge status: Home / Self Care | Payer: PPO | Source: Ambulatory Visit | Attending: Cardiology | Admitting: Cardiology

## 2015-08-21 ENCOUNTER — Ambulatory Visit (INDEPENDENT_AMBULATORY_CARE_PROVIDER_SITE_OTHER): Payer: PPO | Admitting: Podiatry

## 2015-08-21 ENCOUNTER — Ambulatory Visit (INDEPENDENT_AMBULATORY_CARE_PROVIDER_SITE_OTHER): Payer: PPO

## 2015-08-21 VITALS — BP 152/86 | HR 66 | Resp 12

## 2015-08-21 DIAGNOSIS — T148XXA Other injury of unspecified body region, initial encounter: Secondary | ICD-10-CM

## 2015-08-21 DIAGNOSIS — R079 Chest pain, unspecified: Secondary | ICD-10-CM | POA: Insufficient documentation

## 2015-08-21 DIAGNOSIS — T148 Other injury of unspecified body region: Secondary | ICD-10-CM | POA: Diagnosis not present

## 2015-08-21 DIAGNOSIS — Z8249 Family history of ischemic heart disease and other diseases of the circulatory system: Secondary | ICD-10-CM | POA: Insufficient documentation

## 2015-08-21 DIAGNOSIS — M779 Enthesopathy, unspecified: Secondary | ICD-10-CM | POA: Diagnosis not present

## 2015-08-21 DIAGNOSIS — I25708 Atherosclerosis of coronary artery bypass graft(s), unspecified, with other forms of angina pectoris: Secondary | ICD-10-CM | POA: Diagnosis not present

## 2015-08-21 DIAGNOSIS — R0609 Other forms of dyspnea: Secondary | ICD-10-CM | POA: Insufficient documentation

## 2015-08-21 DIAGNOSIS — E785 Hyperlipidemia, unspecified: Secondary | ICD-10-CM | POA: Insufficient documentation

## 2015-08-21 DIAGNOSIS — Z6831 Body mass index (BMI) 31.0-31.9, adult: Secondary | ICD-10-CM | POA: Diagnosis not present

## 2015-08-21 DIAGNOSIS — E669 Obesity, unspecified: Secondary | ICD-10-CM | POA: Diagnosis not present

## 2015-08-21 DIAGNOSIS — M79672 Pain in left foot: Secondary | ICD-10-CM

## 2015-08-21 DIAGNOSIS — R9439 Abnormal result of other cardiovascular function study: Secondary | ICD-10-CM | POA: Diagnosis not present

## 2015-08-21 LAB — MYOCARDIAL PERFUSION IMAGING
CHL CUP MPHR: 144 {beats}/min
CHL CUP NUCLEAR SDS: 2
CHL CUP NUCLEAR SRS: 7
CHL CUP RESTING HR STRESS: 56 {beats}/min
CSEPEDS: 1 s
CSEPHR: 94 %
Estimated workload: 7 METS
Exercise duration (min): 5 min
LV sys vol: 49 mL
LVDIAVOL: 111 mL (ref 62–150)
Peak HR: 136 {beats}/min
RPE: 16
SSS: 9
TID: 1.21

## 2015-08-21 MED ORDER — TECHNETIUM TC 99M TETROFOSMIN IV KIT
31.2000 | PACK | Freq: Once | INTRAVENOUS | Status: AC | PRN
  Administered 2015-08-21: 31.2 via INTRAVENOUS
  Filled 2015-08-21: qty 31

## 2015-08-21 MED ORDER — TECHNETIUM TC 99M TETROFOSMIN IV KIT
10.8000 | PACK | Freq: Once | INTRAVENOUS | Status: AC | PRN
  Administered 2015-08-21: 11 via INTRAVENOUS
  Filled 2015-08-21: qty 11

## 2015-08-21 MED ORDER — TRIAMCINOLONE ACETONIDE 10 MG/ML IJ SUSP
10.0000 mg | Freq: Once | INTRAMUSCULAR | Status: AC
  Administered 2015-08-21: 10 mg

## 2015-08-21 NOTE — Progress Notes (Signed)
   Subjective:    Patient ID: Henry Patrick, male    DOB: 07/01/1940, 75 y.o.   MRN: [id number]  HPI  Chief Complaint  Patient presents with  . Foot Pain    LT FOOT ANKLE IS SWOLLEN/PAINFUL FOR ABOUT 1 WEEK. FOOT  IS WORSE WHEN WALKING/SITTING. TRIED ICE AND NSAID-NO RELIEF     Review of Systems  Cardiovascular: Positive for leg swelling.  Musculoskeletal: Positive for gait problem.       Objective:   Physical Exam        Assessment & Plan:

## 2015-08-23 NOTE — Progress Notes (Signed)
Subjective:     Patient ID: Henry Patrick, male   DOB: 1940-08-02, 75 y.o.   MRN: [id number]  HPI patient states that he's developed a lot of discomfort in the medial side of the left ankle and also had discomfort in the medial side of the left leg which went away. Does not remember specific injury to the area   Review of Systems  All other systems reviewed and are negative.      Objective:   Physical Exam  Constitutional: He is oriented to person, place, and time.  Cardiovascular: Intact distal pulses.   Musculoskeletal: Normal range of motion.  Neurological: He is oriented to person, place, and time.  Skin: Skin is warm.  Nursing note and vitals reviewed.  neurovascular status intact muscle strength adequate range of motion within normal limits with patient found to have inflammation and pain on the medial side the left ankle around the posterior tibial tendon. There was edema also noted and I did check muscle strength and I believe that is intact but it was difficult to make a complete determination due to compensation from other tendon groups that I was not able to isolate. Patient's found have good digital perfusion is well oriented 3     Assessment:     Most likely posterior tibial tendinitis but cannot rule out rupture of the tendon even though it does not appear likely at this time    Plan:     H&P condition reviewed x-ray reviewed and I explained this to him and his wife. At this time we will try conservative care even though I explained we may need to get an MRI if symptoms do not get better and he may require bracing or possible surgery. I did careful injection of the medial posterior tib near its insertion into the navicular and I applied an air fracture walker to immobilize along with usage of ice. Reappoint to recheck again in the next 2 or 3 weeks or earlier if needed  X-ray report was negative for signs of fracture or diastases or depression of the arch

## 2015-09-10 ENCOUNTER — Ambulatory Visit (INDEPENDENT_AMBULATORY_CARE_PROVIDER_SITE_OTHER): Payer: PPO | Admitting: Podiatry

## 2015-09-10 DIAGNOSIS — S8012XA Contusion of left lower leg, initial encounter: Secondary | ICD-10-CM | POA: Diagnosis not present

## 2015-09-10 NOTE — Progress Notes (Signed)
He presents today stating that he has a small knot on the medial aspect of his left calf near the incision site where a native graft was taken for his CABG procedure. He relates that the knot was not bear until after he was mowing his yard and noticed a knot and also noticed pain in his left foot. He was seen by one of our doctors who injected the foot and did not has gotten smaller over the past few weeks however he wanted [place] to check it out. He denies fever chills nausea vomiting muscle aches or pains he denies chest pains or shortness of breath.  Objective: Vital signs are stable he's alert and oriented 3 pulses are strong and palpable. No calf pain. A 3 cm x 3 cm x 2 cm nodule nonpulsatile nature to the medial aspect of the calf demonstrates fluctuance much like a cyst or small hematoma. There is some cyanotic area or ecchymotic area that appears centrally very much like a bruise.  Assessment: Soft tissue mass probably a hematoma cannot rule out a lipoma or even something more aggressive.  Plan: If this does not subside over the next few weeks with warm compresses and massage he is to notify me for an MRI with contrast.

## 2015-12-08 DIAGNOSIS — H02831 Dermatochalasis of right upper eyelid: Secondary | ICD-10-CM | POA: Diagnosis not present

## 2015-12-08 DIAGNOSIS — H02834 Dermatochalasis of left upper eyelid: Secondary | ICD-10-CM | POA: Diagnosis not present

## 2015-12-08 DIAGNOSIS — H25813 Combined forms of age-related cataract, bilateral: Secondary | ICD-10-CM | POA: Diagnosis not present

## 2015-12-22 DIAGNOSIS — I10 Essential (primary) hypertension: Secondary | ICD-10-CM | POA: Diagnosis not present

## 2015-12-22 DIAGNOSIS — E784 Other hyperlipidemia: Secondary | ICD-10-CM | POA: Diagnosis not present

## 2015-12-22 DIAGNOSIS — Z125 Encounter for screening for malignant neoplasm of prostate: Secondary | ICD-10-CM | POA: Diagnosis not present

## 2015-12-29 DIAGNOSIS — Z0000 Encounter for general adult medical examination without abnormal findings: Secondary | ICD-10-CM | POA: Diagnosis not present

## 2015-12-29 DIAGNOSIS — I2510 Atherosclerotic heart disease of native coronary artery without angina pectoris: Secondary | ICD-10-CM | POA: Diagnosis not present

## 2015-12-29 DIAGNOSIS — R600 Localized edema: Secondary | ICD-10-CM | POA: Diagnosis not present

## 2015-12-29 DIAGNOSIS — M545 Low back pain: Secondary | ICD-10-CM | POA: Diagnosis not present

## 2015-12-29 DIAGNOSIS — M4807 Spinal stenosis, lumbosacral region: Secondary | ICD-10-CM | POA: Diagnosis not present

## 2015-12-29 DIAGNOSIS — Z6833 Body mass index (BMI) 33.0-33.9, adult: Secondary | ICD-10-CM | POA: Diagnosis not present

## 2015-12-29 DIAGNOSIS — Z1389 Encounter for screening for other disorder: Secondary | ICD-10-CM | POA: Diagnosis not present

## 2015-12-29 DIAGNOSIS — R635 Abnormal weight gain: Secondary | ICD-10-CM | POA: Diagnosis not present

## 2015-12-29 DIAGNOSIS — I10 Essential (primary) hypertension: Secondary | ICD-10-CM | POA: Diagnosis not present

## 2015-12-29 DIAGNOSIS — Z23 Encounter for immunization: Secondary | ICD-10-CM | POA: Diagnosis not present

## 2015-12-29 DIAGNOSIS — E784 Other hyperlipidemia: Secondary | ICD-10-CM | POA: Diagnosis not present

## 2016-01-14 DIAGNOSIS — Z6833 Body mass index (BMI) 33.0-33.9, adult: Secondary | ICD-10-CM | POA: Diagnosis not present

## 2016-01-14 DIAGNOSIS — I10 Essential (primary) hypertension: Secondary | ICD-10-CM | POA: Diagnosis not present

## 2016-01-20 DIAGNOSIS — I10 Essential (primary) hypertension: Secondary | ICD-10-CM | POA: Diagnosis not present

## 2016-09-23 ENCOUNTER — Encounter (HOSPITAL_COMMUNITY): Payer: Self-pay | Admitting: Emergency Medicine

## 2016-09-23 ENCOUNTER — Ambulatory Visit (HOSPITAL_COMMUNITY)
Admission: EM | Admit: 2016-09-23 | Discharge: 2016-09-23 | Disposition: A | Discharge status: Home / Self Care | Payer: PPO | Attending: Emergency Medicine | Admitting: Emergency Medicine

## 2016-09-23 DIAGNOSIS — T63441A Toxic effect of venom of bees, accidental (unintentional), initial encounter: Secondary | ICD-10-CM | POA: Diagnosis not present

## 2016-09-23 MED ORDER — DIPHENHYDRAMINE HCL 50 MG/ML IJ SOLN
50.0000 mg | Freq: Once | INTRAMUSCULAR | Status: AC
  Administered 2016-09-23: 50 mg via INTRAMUSCULAR

## 2016-09-23 MED ORDER — FAMOTIDINE 20 MG PO TABS
20.0000 mg | ORAL_TABLET | Freq: Once | ORAL | Status: AC
  Administered 2016-09-23: 20 mg via ORAL

## 2016-09-23 MED ORDER — FAMOTIDINE 20 MG PO TABS
ORAL_TABLET | ORAL | Status: AC
  Filled 2016-09-23: qty 1

## 2016-09-23 MED ORDER — DIPHENHYDRAMINE HCL 50 MG/ML IJ SOLN
INTRAMUSCULAR | Status: AC
  Filled 2016-09-23: qty 1

## 2016-09-23 MED ORDER — METHYLPREDNISOLONE ACETATE 80 MG/ML IJ SUSP
INTRAMUSCULAR | Status: AC
  Filled 2016-09-23: qty 1

## 2016-09-23 MED ORDER — METHYLPREDNISOLONE ACETATE 80 MG/ML IJ SUSP
80.0000 mg | Freq: Once | INTRAMUSCULAR | Status: AC
  Administered 2016-09-23: 80 mg via INTRAMUSCULAR

## 2016-09-23 NOTE — ED Triage Notes (Signed)
[facility] a complaint of multiple bee stings that occurred earlier today. The patient reported that he was stung several times on the legs and feet. The patient complained of severe pain to the legs. The patient denied any respiratory distress. The patient also reported taking 50 mg of benadryl PO.

## 2016-09-23 NOTE — Discharge Instructions (Signed)
Continue over-the-counter Benadryl 2 tablets of 50 mg every 6 hours for the next 24-48 hours. If pain persists or fails to resolve up with your regular doctor, if you see any red streaking up your legs or red swelling, go to the ER

## 2016-09-23 NOTE — ED Provider Notes (Signed)
  [facility]   [id number] 09/23/16 Arrival Time: 1854  ASSESSMENT & PLAN:  1. Bee sting, accidental or unintentional, initial encounter     Meds ordered this encounter  Medications  . methylPREDNISolone acetate (DEPO-MEDROL) injection 80 mg  . diphenhydrAMINE (BENADRYL) injection 50 mg  . famotidine (PEPCID) tablet 20 mg    Reviewed expectations re: course of current medical issues. Questions answered. Outlined signs and symptoms indicating need for more acute intervention. Patient verbalized understanding. After Visit Summary given.   SUBJECTIVE:  Henry Patrick is a 76 y.o. male who presents with complaint of Multiple yellow jacket stings to his lower legs and ankles occurred approximately 1 PM today. He has pain in his legs, along with swelling, but no erythema, no red streaking. He has no difficulty swallowing, no difficulty breathing, no wheezing, nausea, vomiting, or other systemic symptoms. Take 50 mg Benadryl by mouth approximately 2 PM this afternoon, has had nothing since  ROS: As per HPI, remainder of ROS negative.   OBJECTIVE:  Vitals:   09/23/16 2018  BP: (!) 152/75  Pulse: 68  Resp: 20  Temp: 97.6 F (36.4 C)  TempSrc: Oral  SpO2: 100%     General appearance: alert; no distress  HEENT: normocephalic; atraumatic; conjunctivae normal;  Lungs: clear to auscultation bilaterally Heart: regular rate and rhythm Abdomen: soft, non-tender; bowel sounds normal; no masses or organomegaly; no guarding or rebound tenderness Back: no CVA tenderness Extremities: no cyanosis or edema; symmetrical with no gross deformities, Multiple areas of swelling on both ankles and calves consistent with stings Skin: warm and dry Neurologic: Grossly normal Psychological:  alert and cooperative; normal mood and affect  Procedures:   Labs Reviewed - No data to display  No results found.  No Known Allergies  PMHx, SurgHx, SocialHx, Medications, and Allergies were  reviewed in the Visit Navigator and updated as appropriate.       Dorena BodoKennard, Henry Frett, NP 09/23/16 2124

## 2016-11-22 NOTE — Progress Notes (Signed)
Henry Patrick Date of Birth: 1940/04/14 Medical Record #[id number]  History of Present Illness: Henry Patrick is seen today for follow up CAD. He has a history of coronary disease and is status post CABG in 1999. This included an LIMA graft to the LAD and a saphenous vein graft to the obtuse marginal vessel. Cath in 2006 showed grafts were patent. Myoview study in April 2012 was normal. Repeat Myoview in July 2017 was felt to be unchanged. He also has a history of dyslipidemia and obesity.   He reports that he has done well over the past year. He denies any chest pain, dyspnea, palpitations or edema.  He has spinal stenosis. 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 He is not walking as much but does stay active.   Current Outpatient Prescriptions on File Prior to Visit  Medication Sig Dispense Refill  . aspirin EC 81 MG tablet Take 1 tablet (81 mg total) by mouth daily. 90 tablet 3  . atorvastatin (LIPITOR) 20 MG tablet Take 20 mg by mouth daily.    . Fish Oil-Cholecalciferol (FISH OIL + D3 PO) Take by mouth.    . Multiple Vitamin (MULTI-VITAMIN PO) Take 1 tablet by mouth daily.     . Omega-3 Fatty Acids (FISH OIL PO) Take 1 capsule by mouth daily.      No current facility-administered medications on file prior to visit.     No Known Allergies  Past Medical History:  Diagnosis Date  . Coronary artery disease    Atherosclerotic CAD  . Dyslipidemia    History of dyslipidemia  . History of arthritis   . Hyperlipidemia   . Obesity   . Plantar fasciitis   . Spinal stenosis     Past Surgical History:  Procedure Laterality Date  . CARDIAC CATHETERIZATION  08/02/2004   ejection fraction estimated 60%  . CORONARY ARTERY BYPASS GRAFT  1999   x2 by Dr. Armida [email or web address]  . TONSILLECTOMY      History  Smoking Status  . Never Smoker  Smokeless Tobacco  . Never Used    History  Alcohol Use No    Family  History  Problem Relation Age of Onset  . Heart attack Mother   . Heart attack Father 64  . Hypertension Brother   . Cirrhosis Sister     Review of Systems: As noted in HPI.  All other systems were reviewed and are negative.  Physical Exam: BP 122/60 (BP Location: Left Arm, Patient Position: Sitting, Cuff Size: Normal)   Pulse 67   Ht  (1.753 m)   Wt 206 lb 12.8 oz (93.8 kg)   BMI 30.54 kg/m  GENERAL:  Well appearing, obese WM HEENT:  PERRL, EOMI, sclera are clear. Oropharynx is clear. NECK:  No jugular venous distention, carotid upstroke brisk and symmetric, no bruits, no thyromegaly or adenopathy LUNGS:  Clear to auscultation bilaterally CHEST:  Unremarkable HEART:  RRR,  PMI not displaced or sustained,S1 and S2 within normal limits, no S3, no S4: no clicks, no rubs, no murmurs ABD:  Soft, nontender. BS +, no masses or bruits. No hepatomegaly, no splenomegaly EXT:  2 + pulses throughout, no edema, no cyanosis no clubbing SKIN:  Warm and dry.  No rashes NEURO:  Alert and oriented x 3. Cranial nerves II through XII intact. PSYCH:  Cognitively intact    LABORATORY DATA:  Labs dated 12/22/15: cholesterol 139, triglycerides 121, HDL  37, LDL 78. CBC, chemistries and TSH normal.  ECG today shows normal sinus rhythm with first degree AV block. RBBB that is new compared to June 2017. I have personally reviewed and interpreted this study.  Myoview 08/21/15:  Notes Recorded by Emyah Roznowski M [place], MD on 08/21/2015 at 2:36 PM I reviewed his stress Myoview and compared it to the one in 2012. I really don't think there is a significant change. The Ecg changes were there previously. No significant ischemia noted on perfusion imaging and EF is normal. Recommend continued medical therapy.  Anastazja Isaac [place] MD, [facility]       Vitals   Height Weight BMI (Calculated)   (1.753 m) 214 lb (97.1 kg) 31.7  Study Highlights    Nuclear stress EF: 56%.  The left ventricular ejection fraction is  normal (55-65%).  Horizontal ST segment depression of 1 mm was noted during stress in the II, III, aVF, V5 and V6 leads. In recovery there was downsloping of the ST segments with T wave inversions in the same leads.  There is a medium defect of moderate severity present in the apical anterior, apical lateral and apex location. The defect non-reversible.  This is a high risk study due to ischemic ST changes noted on stress EKG as well as borderline TID of 1.21 which may indicate multivessel CAD.      Assessment / Plan: 1. CAD s/p CABG in 1997. Normal Myoview in July 2017. He remains asymptomatic. Continue ASA and statin therapy. On beta blocker. Follow up in one year.  2. Hyperlipidemia. Continue statin therapy. Labs followed by primary care. Scheduled for next month.  3. RBBB new. No symptoms. Benign.

## 2016-11-25 ENCOUNTER — Encounter: Payer: Self-pay | Admitting: Cardiology

## 2016-11-25 ENCOUNTER — Ambulatory Visit (INDEPENDENT_AMBULATORY_CARE_PROVIDER_SITE_OTHER): Payer: PPO | Admitting: Cardiology

## 2016-11-25 VITALS — BP 122/60 | HR 67 | Ht 69.0 in | Wt 206.8 lb

## 2016-11-25 DIAGNOSIS — I4510 Unspecified right bundle-branch block: Secondary | ICD-10-CM

## 2016-11-25 DIAGNOSIS — E785 Hyperlipidemia, unspecified: Secondary | ICD-10-CM

## 2016-11-25 DIAGNOSIS — I25810 Atherosclerosis of coronary artery bypass graft(s) without angina pectoris: Secondary | ICD-10-CM

## 2016-11-25 NOTE — Patient Instructions (Signed)
Continue your current therapy  I will see you in one year   

## 2016-12-07 DIAGNOSIS — H02831 Dermatochalasis of right upper eyelid: Secondary | ICD-10-CM | POA: Diagnosis not present

## 2016-12-07 DIAGNOSIS — H02834 Dermatochalasis of left upper eyelid: Secondary | ICD-10-CM | POA: Diagnosis not present

## 2016-12-07 DIAGNOSIS — H25813 Combined forms of age-related cataract, bilateral: Secondary | ICD-10-CM | POA: Diagnosis not present

## 2016-12-19 DIAGNOSIS — H2511 Age-related nuclear cataract, right eye: Secondary | ICD-10-CM | POA: Diagnosis not present

## 2016-12-28 DIAGNOSIS — R82998 Other abnormal findings in urine: Secondary | ICD-10-CM | POA: Diagnosis not present

## 2016-12-28 DIAGNOSIS — Z125 Encounter for screening for malignant neoplasm of prostate: Secondary | ICD-10-CM | POA: Diagnosis not present

## 2016-12-28 DIAGNOSIS — H2512 Age-related nuclear cataract, left eye: Secondary | ICD-10-CM | POA: Diagnosis not present

## 2016-12-28 DIAGNOSIS — E7849 Other hyperlipidemia: Secondary | ICD-10-CM | POA: Diagnosis not present

## 2016-12-28 DIAGNOSIS — I10 Essential (primary) hypertension: Secondary | ICD-10-CM | POA: Diagnosis not present

## 2017-01-02 DIAGNOSIS — H2512 Age-related nuclear cataract, left eye: Secondary | ICD-10-CM | POA: Diagnosis not present

## 2017-01-02 DIAGNOSIS — H25812 Combined forms of age-related cataract, left eye: Secondary | ICD-10-CM | POA: Diagnosis not present

## 2017-01-09 DIAGNOSIS — Z0000 Encounter for general adult medical examination without abnormal findings: Secondary | ICD-10-CM | POA: Diagnosis not present

## 2017-01-09 DIAGNOSIS — E668 Other obesity: Secondary | ICD-10-CM | POA: Diagnosis not present

## 2017-01-09 DIAGNOSIS — Z6832 Body mass index (BMI) 32.0-32.9, adult: Secondary | ICD-10-CM | POA: Diagnosis not present

## 2017-01-09 DIAGNOSIS — M545 Low back pain: Secondary | ICD-10-CM | POA: Diagnosis not present

## 2017-01-09 DIAGNOSIS — E7849 Other hyperlipidemia: Secondary | ICD-10-CM | POA: Diagnosis not present

## 2017-01-09 DIAGNOSIS — Z1389 Encounter for screening for other disorder: Secondary | ICD-10-CM | POA: Diagnosis not present

## 2017-01-09 DIAGNOSIS — I2510 Atherosclerotic heart disease of native coronary artery without angina pectoris: Secondary | ICD-10-CM | POA: Diagnosis not present

## 2017-01-09 DIAGNOSIS — R600 Localized edema: Secondary | ICD-10-CM | POA: Diagnosis not present

## 2017-01-09 DIAGNOSIS — M48061 Spinal stenosis, lumbar region without neurogenic claudication: Secondary | ICD-10-CM | POA: Diagnosis not present

## 2017-01-09 DIAGNOSIS — I10 Essential (primary) hypertension: Secondary | ICD-10-CM | POA: Diagnosis not present

## 2017-01-13 DIAGNOSIS — Z1212 Encounter for screening for malignant neoplasm of rectum: Secondary | ICD-10-CM | POA: Diagnosis not present

## 2017-11-02 ENCOUNTER — Encounter: Payer: Self-pay | Admitting: Cardiology

## 2017-11-13 NOTE — Progress Notes (Signed)
Ethel Rana Frisch Date of Birth: [birth year] Medical Record #[id number]  History of Present Illness: Henry Patrick is seen today for follow up CAD. He has a history of coronary disease and is status post CABG in 1999. This included an LIMA graft to the LAD and a saphenous vein graft to the obtuse marginal vessel. Cath in 2006 showed grafts were patent. Myoview study in April 2012 was normal. Repeat Myoview in July 2017 was felt to be unchanged. He also has a history of dyslipidemia and obesity.    He reports that he has done well over the past year. He denies any chest pain, dyspnea, palpitations or edema.  He is still limited by spinal stenosis and neuropathy. Really has no other complaints.   Current Outpatient Medications on File Prior to Visit  Medication Sig Dispense Refill  . aspirin EC 81 MG tablet Take 1 tablet (81 mg total) by mouth daily. 90 tablet 3  . atorvastatin (LIPITOR) 20 MG tablet Take 20 mg by mouth daily.    . Fish Oil-Cholecalciferol (FISH OIL + D3 PO) Take by mouth.    . Lysine 500 MG TABS Take by mouth.    . metoprolol succinate (TOPROL-XL) 25 MG 24 hr tablet Take 25 mg by mouth daily. for blood pressure  0  . Multiple Vitamin (MULTI-VITAMIN PO) Take 1 tablet by mouth daily.     . naproxen sodium (ALEVE) 220 MG tablet Take 440 mg by mouth daily.    . Omega-3 Fatty Acids (FISH OIL PO) Take 1 capsule by mouth daily.      No current facility-administered medications on file prior to visit.     No Known Allergies  Past Medical History:  Diagnosis Date  . Coronary artery disease    Atherosclerotic CAD  . Dyslipidemia    History of dyslipidemia  . History of arthritis   . Hyperlipidemia   . Obesity   . Plantar fasciitis   . Spinal stenosis     Past Surgical History:  Procedure Laterality Date  . CARDIAC CATHETERIZATION  08/02/2004   ejection fraction estimated 60%  . CORONARY ARTERY BYPASS GRAFT  1999   x2 by Dr. Armida Sans.[email or web address]  . TONSILLECTOMY       Social History   Tobacco Use  Smoking Status Never Smoker  Smokeless Tobacco Never Used    Social History   Substance and Sexual Activity  Alcohol Use No  . Alcohol/week: 0.0 standard drinks    Family History  Problem Relation Age of Onset  . Heart attack Mother   . Heart attack Father 66  . Hypertension Brother   . Cirrhosis Sister     Review of Systems: As noted in HPI.  All other systems were reviewed and are negative.  Physical Exam: BP (!) 148/72   Pulse (!) 57   Ht 5\' 9"  (1.753 m)   Wt 210 lb 9.6 oz (95.5 kg)   BMI 31.10 kg/m  GENERAL:  Well appearing obese WM in NAD HEENT:  PERRL, EOMI, sclera are clear. Oropharynx is clear. NECK:  No jugular venous distention, carotid upstroke brisk and symmetric, no bruits, no thyromegaly or adenopathy LUNGS:  Clear to auscultation bilaterally CHEST:  Unremarkable HEART:  RRR,  PMI not displaced or sustained,S1 and S2 within normal limits, no S3, no S4: no clicks, no rubs, no murmurs ABD:  Soft, nontender. BS +, no masses or bruits. No hepatomegaly, no splenomegaly EXT:  2 + pulses throughout, no edema, no cyanosis no clubbing  SKIN:  Warm and dry.  No rashes NEURO:  Alert and oriented x 3. Cranial nerves II through XII intact. PSYCH:  Cognitively intact      LABORATORY DATA:  Labs dated 12/22/15: cholesterol 139, triglycerides 121, HDL 37, LDL 78. CBC, chemistries and TSH normal. Dated 12/28/16: cholesterol 151, triglycerides 147, HDL 39, LDL 83. Chemistries and hgb normal.   ECG today shows normal sinus rhythm with first degree AV block. RBBB. Unchanged. I have personally reviewed and interpreted this study.   Myoview 08/21/15:  Notes Recorded by Daimon Kean M [place], MD on 08/21/2015 at 2:36 PM I reviewed his stress Myoview and compared it to the one in 2012. I really don't think there is a significant change. The Ecg changes were there previously. No significant ischemia noted on perfusion imaging and EF is normal.  Recommend continued medical therapy.  Estie Sproule [place] MD, [facility]       Vitals   Height Weight BMI (Calculated)  5\' 9"  (1.753 m) 214 lb (97.1 kg) 31.7  Study Highlights    Nuclear stress EF: 56%.  The left ventricular ejection fraction is normal (55-65%).  Horizontal ST segment depression of 1 mm was noted during stress in the II, III, aVF, V5 and V6 leads. In recovery there was downsloping of the ST segments with T wave inversions in the same leads.  There is a medium defect of moderate severity present in the apical anterior, apical lateral and apex location. The defect non-reversible.  This is a high risk study due to ischemic ST changes noted on stress EKG as well as borderline TID of 1.21 which may indicate multivessel CAD.      Assessment / Plan: 1. CAD s/p CABG in 1999. Normal Myoview in July 2017. He remains asymptomatic. Continue ASA and statin therapy. On beta blocker. Follow up in one year.  2. Hyperlipidemia. Ideally would like LDL < 70. Scheduled for complete physical with primary care in November. Will have lab done then. On statin.  3. RBBB new.  Benign.

## 2017-11-15 ENCOUNTER — Encounter: Payer: Self-pay | Admitting: Cardiology

## 2017-11-15 ENCOUNTER — Ambulatory Visit: Payer: PPO | Admitting: Cardiology

## 2017-11-15 VITALS — BP 148/72 | HR 57 | Ht 69.0 in | Wt 210.6 lb

## 2017-11-15 DIAGNOSIS — E785 Hyperlipidemia, unspecified: Secondary | ICD-10-CM

## 2017-11-15 DIAGNOSIS — I25810 Atherosclerosis of coronary artery bypass graft(s) without angina pectoris: Secondary | ICD-10-CM

## 2017-11-15 DIAGNOSIS — I4510 Unspecified right bundle-branch block: Secondary | ICD-10-CM | POA: Diagnosis not present

## 2017-11-15 NOTE — Patient Instructions (Signed)
Continue your current therapy  I will see you in one year   

## 2017-12-14 ENCOUNTER — Encounter

## 2017-12-22 DIAGNOSIS — M79661 Pain in right lower leg: Secondary | ICD-10-CM | POA: Diagnosis not present

## 2018-01-15 DIAGNOSIS — R82998 Other abnormal findings in urine: Secondary | ICD-10-CM | POA: Diagnosis not present

## 2018-01-15 DIAGNOSIS — E7849 Other hyperlipidemia: Secondary | ICD-10-CM | POA: Diagnosis not present

## 2018-01-15 DIAGNOSIS — Z125 Encounter for screening for malignant neoplasm of prostate: Secondary | ICD-10-CM | POA: Diagnosis not present

## 2018-01-15 DIAGNOSIS — I10 Essential (primary) hypertension: Secondary | ICD-10-CM | POA: Diagnosis not present

## 2018-01-22 DIAGNOSIS — I2510 Atherosclerotic heart disease of native coronary artery without angina pectoris: Secondary | ICD-10-CM | POA: Diagnosis not present

## 2018-01-22 DIAGNOSIS — R600 Localized edema: Secondary | ICD-10-CM | POA: Diagnosis not present

## 2018-01-22 DIAGNOSIS — E7849 Other hyperlipidemia: Secondary | ICD-10-CM | POA: Diagnosis not present

## 2018-01-22 DIAGNOSIS — Z6831 Body mass index (BMI) 31.0-31.9, adult: Secondary | ICD-10-CM | POA: Diagnosis not present

## 2018-01-22 DIAGNOSIS — N390 Urinary tract infection, site not specified: Secondary | ICD-10-CM | POA: Diagnosis not present

## 2018-01-22 DIAGNOSIS — R1084 Generalized abdominal pain: Secondary | ICD-10-CM | POA: Diagnosis not present

## 2018-01-22 DIAGNOSIS — N528 Other male erectile dysfunction: Secondary | ICD-10-CM | POA: Diagnosis not present

## 2018-01-22 DIAGNOSIS — Z0000 Encounter for general adult medical examination without abnormal findings: Secondary | ICD-10-CM | POA: Diagnosis not present

## 2018-01-22 DIAGNOSIS — I10 Essential (primary) hypertension: Secondary | ICD-10-CM | POA: Diagnosis not present

## 2018-01-22 DIAGNOSIS — M545 Low back pain: Secondary | ICD-10-CM | POA: Diagnosis not present

## 2018-01-22 DIAGNOSIS — M48061 Spinal stenosis, lumbar region without neurogenic claudication: Secondary | ICD-10-CM | POA: Diagnosis not present

## 2018-01-22 DIAGNOSIS — Z1389 Encounter for screening for other disorder: Secondary | ICD-10-CM | POA: Diagnosis not present

## 2018-02-20 DIAGNOSIS — I10 Essential (primary) hypertension: Secondary | ICD-10-CM | POA: Diagnosis not present

## 2018-03-05 DIAGNOSIS — K219 Gastro-esophageal reflux disease without esophagitis: Secondary | ICD-10-CM | POA: Diagnosis not present

## 2018-03-05 DIAGNOSIS — R195 Other fecal abnormalities: Secondary | ICD-10-CM | POA: Diagnosis not present

## 2018-03-05 DIAGNOSIS — R1030 Lower abdominal pain, unspecified: Secondary | ICD-10-CM | POA: Diagnosis not present

## 2018-03-05 DIAGNOSIS — K625 Hemorrhage of anus and rectum: Secondary | ICD-10-CM | POA: Diagnosis not present

## 2018-03-05 DIAGNOSIS — K5901 Slow transit constipation: Secondary | ICD-10-CM | POA: Diagnosis not present

## 2018-03-27 DIAGNOSIS — R195 Other fecal abnormalities: Secondary | ICD-10-CM | POA: Diagnosis not present

## 2018-04-09 DIAGNOSIS — K582 Mixed irritable bowel syndrome: Secondary | ICD-10-CM | POA: Diagnosis not present

## 2018-04-09 DIAGNOSIS — R195 Other fecal abnormalities: Secondary | ICD-10-CM | POA: Diagnosis not present

## 2018-04-09 DIAGNOSIS — R1030 Lower abdominal pain, unspecified: Secondary | ICD-10-CM | POA: Diagnosis not present

## 2018-04-09 DIAGNOSIS — R152 Fecal urgency: Secondary | ICD-10-CM | POA: Diagnosis not present

## 2018-04-09 DIAGNOSIS — K5901 Slow transit constipation: Secondary | ICD-10-CM | POA: Diagnosis not present

## 2018-04-09 DIAGNOSIS — K219 Gastro-esophageal reflux disease without esophagitis: Secondary | ICD-10-CM | POA: Diagnosis not present

## 2018-04-25 DIAGNOSIS — R05 Cough: Secondary | ICD-10-CM | POA: Diagnosis not present

## 2018-04-25 DIAGNOSIS — J189 Pneumonia, unspecified organism: Secondary | ICD-10-CM | POA: Diagnosis not present

## 2018-04-25 DIAGNOSIS — H1089 Other conjunctivitis: Secondary | ICD-10-CM | POA: Diagnosis not present

## 2018-05-16 DIAGNOSIS — I119 Hypertensive heart disease without heart failure: Secondary | ICD-10-CM | POA: Diagnosis not present

## 2018-05-16 DIAGNOSIS — J189 Pneumonia, unspecified organism: Secondary | ICD-10-CM | POA: Diagnosis not present

## 2018-05-16 DIAGNOSIS — H1089 Other conjunctivitis: Secondary | ICD-10-CM | POA: Diagnosis not present

## 2018-05-16 DIAGNOSIS — R05 Cough: Secondary | ICD-10-CM | POA: Diagnosis not present

## 2018-07-30 DIAGNOSIS — R109 Unspecified abdominal pain: Secondary | ICD-10-CM | POA: Diagnosis not present

## 2018-07-30 DIAGNOSIS — I2510 Atherosclerotic heart disease of native coronary artery without angina pectoris: Secondary | ICD-10-CM | POA: Diagnosis not present

## 2018-07-30 DIAGNOSIS — I119 Hypertensive heart disease without heart failure: Secondary | ICD-10-CM | POA: Diagnosis not present

## 2018-08-01 ENCOUNTER — Other Ambulatory Visit: Payer: Self-pay | Admitting: Internal Medicine

## 2018-08-01 DIAGNOSIS — R109 Unspecified abdominal pain: Secondary | ICD-10-CM

## 2018-08-01 DIAGNOSIS — I25119 Atherosclerotic heart disease of native coronary artery with unspecified angina pectoris: Secondary | ICD-10-CM

## 2018-08-15 ENCOUNTER — Ambulatory Visit
Admission: RE | Admit: 2018-08-15 | Discharge: 2018-08-15 | Disposition: A | Discharge status: Home / Self Care | Payer: PPO | Source: Ambulatory Visit | Attending: Internal Medicine | Admitting: Internal Medicine

## 2018-08-15 DIAGNOSIS — R109 Unspecified abdominal pain: Secondary | ICD-10-CM

## 2018-08-15 DIAGNOSIS — I25119 Atherosclerotic heart disease of native coronary artery with unspecified angina pectoris: Secondary | ICD-10-CM

## 2018-11-23 NOTE — Progress Notes (Signed)
Henry Patrick Date of Birth: 20-Mar-1940 Medical Record #[id number]  History of Present Illness: Henry Patrick is seen today for follow up CAD. He has a history of coronary disease and is status post CABG in 1999. This included an LIMA graft to the LAD and a saphenous vein graft to the obtuse marginal vessel. Cath in 2006 showed grafts were patent. Myoview study in April 2012 was normal. Repeat Myoview in July 2017 was felt to be unchanged. He also has a history of dyslipidemia and obesity.    He reports that he has done well over the past year. His major complaint is of leg and back pain. Worse when he is at rest. He does have spinal stenosis and neuropathy.  He denies any chest pain, dyspnea, palpitations or edema. He notes his twin brother died this year following treatment for AAA. Apparently had a MI.  Current Outpatient Medications on File Prior to Visit  Medication Sig Dispense Refill  . aspirin EC 81 MG tablet Take 1 tablet (81 mg total) by mouth daily. 90 tablet 3  . atorvastatin (LIPITOR) 20 MG tablet Take 20 mg by mouth daily.    . Fish Oil-Cholecalciferol (FISH OIL + D3 PO) Take by mouth.    . Lysine 500 MG TABS Take by mouth.    . metoprolol succinate (TOPROL-XL) 25 MG 24 hr tablet Take 25 mg by mouth daily. for blood pressure  0  . Multiple Vitamin (MULTI-VITAMIN PO) Take 1 tablet by mouth daily.     . naproxen sodium (ALEVE) 220 MG tablet Take 440 mg by mouth daily.    Marland Kitchen olmesartan (BENICAR) 20 MG tablet Take 20 mg by mouth daily.    . Omega-3 Fatty Acids (FISH OIL PO) Take 1 capsule by mouth daily.      No current facility-administered medications on file prior to visit.     No Known Allergies  Past Medical History:  Diagnosis Date  . Coronary artery disease    Atherosclerotic CAD  . Dyslipidemia    History of dyslipidemia  . History of arthritis   . Hyperlipidemia   . Obesity   . Plantar fasciitis   . Spinal stenosis     Past Surgical History:  Procedure  Laterality Date  . CARDIAC CATHETERIZATION  08/02/2004   ejection fraction estimated 60%  . CORONARY ARTERY BYPASS GRAFT  1999   x2 by Dr. Melrose Nakayama.[email or web address]  . TONSILLECTOMY      Social History   Tobacco Use  Smoking Status Never Smoker  Smokeless Tobacco Never Used    Social History   Substance and Sexual Activity  Alcohol Use No  . Alcohol/week: 0.0 standard drinks    Family History  Problem Relation Age of Onset  . Heart attack Mother   . Heart attack Father 38  . Hypertension Brother   . Cirrhosis Sister     Review of Systems: As noted in HPI.  All other systems were reviewed and are negative.  Physical Exam: BP 130/70 (BP Location: Left Arm, Patient Position: Sitting, Cuff Size: Normal)   Pulse 63   Temp 97.6 F (36.4 C)   Ht 5\' 9"  (1.753 m)   Wt 204 lb (92.5 kg)   BMI 30.13 kg/m  GENERAL:  Well appearing obese WM in NAD HEENT:  PERRL, EOMI, sclera are clear. Oropharynx is clear. NECK:  No jugular venous distention, carotid upstroke brisk and symmetric, no bruits, no thyromegaly or adenopathy LUNGS:  Clear to auscultation bilaterally CHEST:  Unremarkable HEART:  RRR,  PMI not displaced or sustained,S1 and S2 within normal limits, no S3, no S4: no clicks, no rubs, no murmurs ABD:  Soft, nontender. BS +, no masses or bruits. No hepatomegaly, no splenomegaly EXT:  2 + pulses throughout, no edema, no cyanosis no clubbing SKIN:  Warm and dry.  No rashes NEURO:  Alert and oriented x 3. Cranial nerves II through XII intact. PSYCH:  Cognitively intact      LABORATORY DATA:  Labs dated 12/22/15: cholesterol 139, triglycerides 121, HDL 37, LDL 78. CBC, chemistries and TSH normal. Dated 12/28/16: cholesterol 151, triglycerides 147, HDL 39, LDL 83. Chemistries and hgb normal.  Dated 01/15/18: cholesterol 140, triglycerides 124, HDL 38, LDL 77.  Dated 02/21/18: creatinine 1.15.  Dated 04/25/18: normal Hgb.   ECG today shows normal sinus rhythm with first  degree AV block. RBBB. Unchanged. I have personally reviewed and interpreted this study.   Myoview 08/21/15:  Notes Recorded by Aela Bohan M [place], MD on 08/21/2015 at 2:36 PM I reviewed his stress Myoview and compared it to the one in 2012. I really don't think there is a significant change. The Ecg changes were there previously. No significant ischemia noted on perfusion imaging and EF is normal. Recommend continued medical therapy.  Miarose Lippert [place] MD, [facility]       Vitals   Height Weight BMI (Calculated)  5\' 9"  (1.753 m) 214 lb (97.1 kg) 31.7  Study Highlights    Nuclear stress EF: 56%.  The left ventricular ejection fraction is normal (55-65%).  Horizontal ST segment depression of 1 mm was noted during stress in the II, III, aVF, V5 and V6 leads. In recovery there was downsloping of the ST segments with T wave inversions in the same leads.  There is a medium defect of moderate severity present in the apical anterior, apical lateral and apex location. The defect non-reversible.  This is a high risk study due to ischemic ST changes noted on stress EKG as well as borderline TID of 1.21 which may indicate multivessel CAD.      Assessment / Plan: 1. CAD s/p CABG in 1999. Normal Myoview in July 2017. He remains asymptomatic. Continue ASA and statin therapy. On beta blocker. Follow up in one year.  2. Hyperlipidemia. Ideally would like LDL < 70. Scheduled for complete physical with primary care in December/Jan Will have lab done then. On statin.  3. RBBB new.  Benign.   4. Family history of AAA. August 2017 in July was negative for aneurysm.

## 2018-11-26 ENCOUNTER — Ambulatory Visit: Payer: PPO | Admitting: Cardiology

## 2018-11-26 ENCOUNTER — Encounter: Payer: Self-pay | Admitting: Cardiology

## 2018-11-26 ENCOUNTER — Other Ambulatory Visit: Payer: Self-pay

## 2018-11-26 VITALS — BP 130/70 | HR 63 | Temp 97.6°F | Ht 69.0 in | Wt 204.0 lb

## 2018-11-26 DIAGNOSIS — E785 Hyperlipidemia, unspecified: Secondary | ICD-10-CM

## 2018-11-26 DIAGNOSIS — I25810 Atherosclerosis of coronary artery bypass graft(s) without angina pectoris: Secondary | ICD-10-CM | POA: Diagnosis not present

## 2018-11-26 DIAGNOSIS — I4510 Unspecified right bundle-branch block: Secondary | ICD-10-CM | POA: Diagnosis not present

## 2019-01-21 DIAGNOSIS — R82998 Other abnormal findings in urine: Secondary | ICD-10-CM | POA: Diagnosis not present

## 2019-01-21 DIAGNOSIS — Z125 Encounter for screening for malignant neoplasm of prostate: Secondary | ICD-10-CM | POA: Diagnosis not present

## 2019-01-21 DIAGNOSIS — I10 Essential (primary) hypertension: Secondary | ICD-10-CM | POA: Diagnosis not present

## 2019-01-29 DIAGNOSIS — E669 Obesity, unspecified: Secondary | ICD-10-CM | POA: Diagnosis not present

## 2019-01-29 DIAGNOSIS — M545 Low back pain: Secondary | ICD-10-CM | POA: Diagnosis not present

## 2019-01-29 DIAGNOSIS — R600 Localized edema: Secondary | ICD-10-CM | POA: Diagnosis not present

## 2019-01-29 DIAGNOSIS — Z1339 Encounter for screening examination for other mental health and behavioral disorders: Secondary | ICD-10-CM | POA: Diagnosis not present

## 2019-01-29 DIAGNOSIS — Z0000 Encounter for general adult medical examination without abnormal findings: Secondary | ICD-10-CM | POA: Diagnosis not present

## 2019-01-29 DIAGNOSIS — M48061 Spinal stenosis, lumbar region without neurogenic claudication: Secondary | ICD-10-CM | POA: Diagnosis not present

## 2019-01-29 DIAGNOSIS — I2510 Atherosclerotic heart disease of native coronary artery without angina pectoris: Secondary | ICD-10-CM | POA: Diagnosis not present

## 2019-01-29 DIAGNOSIS — I10 Essential (primary) hypertension: Secondary | ICD-10-CM | POA: Diagnosis not present

## 2019-01-29 DIAGNOSIS — Z1331 Encounter for screening for depression: Secondary | ICD-10-CM | POA: Diagnosis not present

## 2019-01-29 DIAGNOSIS — E785 Hyperlipidemia, unspecified: Secondary | ICD-10-CM | POA: Diagnosis not present

## 2019-01-29 DIAGNOSIS — M542 Cervicalgia: Secondary | ICD-10-CM | POA: Diagnosis not present

## 2019-01-30 DIAGNOSIS — Z1212 Encounter for screening for malignant neoplasm of rectum: Secondary | ICD-10-CM | POA: Diagnosis not present

## 2019-02-06 ENCOUNTER — Telehealth: Payer: Self-pay | Admitting: *Deleted

## 2019-02-06 NOTE — Telephone Encounter (Signed)
Called pt and lvm #1. (proficeint referral)

## 2019-02-26 ENCOUNTER — Ambulatory Visit (INDEPENDENT_AMBULATORY_CARE_PROVIDER_SITE_OTHER): Payer: PPO | Admitting: Physical Medicine and Rehabilitation

## 2019-02-26 ENCOUNTER — Encounter: Payer: Self-pay | Admitting: Physical Medicine and Rehabilitation

## 2019-02-26 ENCOUNTER — Other Ambulatory Visit: Payer: Self-pay

## 2019-02-26 VITALS — BP 130/77 | HR 63 | Ht 69.0 in | Wt 194.0 lb

## 2019-02-26 DIAGNOSIS — M542 Cervicalgia: Secondary | ICD-10-CM | POA: Diagnosis not present

## 2019-02-26 DIAGNOSIS — M47812 Spondylosis without myelopathy or radiculopathy, cervical region: Secondary | ICD-10-CM

## 2019-02-26 DIAGNOSIS — M545 Low back pain: Secondary | ICD-10-CM

## 2019-02-26 DIAGNOSIS — R202 Paresthesia of skin: Secondary | ICD-10-CM | POA: Diagnosis not present

## 2019-02-26 DIAGNOSIS — M7918 Myalgia, other site: Secondary | ICD-10-CM | POA: Diagnosis not present

## 2019-02-26 DIAGNOSIS — G8929 Other chronic pain: Secondary | ICD-10-CM | POA: Diagnosis not present

## 2019-02-26 DIAGNOSIS — M25512 Pain in left shoulder: Secondary | ICD-10-CM

## 2019-02-26 NOTE — Progress Notes (Signed)
 .  Numeric Pain Rating Scale and Functional Assessment Average Pain 9 Pain Right Now 9 My pain is constant and sharp Pain is worse with: sitting, some activites and turning head to the left side Pain improves with: therapy/exercise   In the last MONTH (on 0-10 scale) has pain interfered with the following?  1. General activity like being  able to carry out your everyday physical activities such as walking, climbing stairs, carrying groceries, or moving a chair?  Rating(9)  2. Relation with others like being able to carry out your usual social activities and roles such as  activities at home, at work and in your community. Rating(9)  3. Enjoyment of life such that you have  been bothered by emotional problems such as feeling anxious, depressed or irritable?  Rating(6)

## 2019-02-26 NOTE — Progress Notes (Signed)
Henry Patrick - 79 y.o. male MRN [id number]  Date of birth: Nov 25, 1940  Office Visit Note: Visit Date: 02/26/2019 PCP: Jarome Matin, MD Referred by: Jarome Matin, MD  Subjective: Chief Complaint  Patient presents with  . Neck - Pain  . Left Shoulder - Pain  . Left Arm - Pain, Numbness, Weakness  . Lower Back - Pain   HPI: Henry Patrick is a 79 y.o. male who comes in today At the request Dr. Dossie Arbour for evaluation and management of severe neck pain and left shoulder pain.  The patient actually complains of left-sided neck pain radiating into the trapezius and left shoulder with paresthesias into the arm and hand as well as paresthesia in the right hand.  He reports history of lumbar stenosis and pain really all over in his lower back and legs.  He reports no specific trauma.  He reports the symptoms started many years ago but more recently he has noticed that if he turns his head to the left to look for traffic he gets a lot of pain in the left neck that refers down into the trapezius and shoulder blade.  He denies any pain on the right.  He reports worsening with sitting and sleeping it does wake him up at times.  He reports activity and movement seems to help.  He rates his pain as a 9 out of 10 constant and sharp pain.  He has gotten some improvement again with exercise and heat and ice.  He has been using gabapentin.  He has no history of cervical surgery.  He also reports paresthesias into the hands at times he denies any specific weakness however.  Has had no prior electrodiagnostic studies.  No history of diabetes.  He does not endorse any associated headaches.  Case is complicated by coronary artery disease but is not on anticoagulation.  He has not had advanced imaging of the cervical spine.  Cervical x-ray performed by Dr. Jarold Motto showed general degenerative changes and facet arthritis without any worrisome features.  Review of Systems  Constitutional: Negative for  chills, fever, malaise/fatigue and weight loss.  HENT: Negative for hearing loss and sinus pain.   Eyes: Negative for blurred vision, double vision and photophobia.  Respiratory: Negative for cough and shortness of breath.   Cardiovascular: Negative for chest pain, palpitations and leg swelling.  Gastrointestinal: Negative for abdominal pain, nausea and vomiting.  Genitourinary: Negative for flank pain.  Musculoskeletal: Positive for back pain, joint pain and neck pain. Negative for myalgias.  Skin: Negative for itching and rash.  Neurological: Positive for tingling. Negative for tremors, focal weakness and weakness.  Endo/Heme/Allergies: Negative.   Psychiatric/Behavioral: Negative for depression.  All other systems reviewed and are negative.  Otherwise per HPI.  Assessment & Plan: Visit Diagnoses:  1. Cervicalgia   2. Cervical spondylosis without myelopathy   3. Chronic left shoulder pain   4. Myofascial pain syndrome   5. Chronic bilateral low back pain without sciatica   6. Paresthesia of skin     Plan: Findings:  Main problem is chronic worsening severe left-sided neck pain worse with left rotation and some referral into the trapezius.  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  He has some myofascial trigger points could also explain this as well.  He does not really have any shoulder impingement signs except at end ranges no more right than left.  He has  had conservative care with time management as well as medication without relief.  He would probably benefit from coordinated physical therapy for spine stabilization and manual treatment and may be even dry needling.  He would also benefit from diagnostic facet joint block.  This will be done with fluoroscopic guidance.  He can continue to take the gabapentin.  We also discussed neck exercises today at great length as well as mobility exercises for the spine and those  were reviewed with him and demonstrated.  We also went over the spine model of the cervical spine.  Unfortunately he is due to have his corona virus vaccine soon.  We cannot perform any steroid injection around that time.  He wants to hold off anyway and see how that goes.  He also wants to hold off for now on physical therapy.  In terms of his back pain he really has just been managing on his own.  We reviewed this briefly and I be happy to see him again in terms of that as well.  We will get him some strategies to help with his neck pain will see how he does.  In terms of his paresthesias he might do well electrodiagnostic study of the upper limbs he may have carpal tunnel syndrome.    Meds & Orders: No orders of the defined types were placed in this encounter.  No orders of the defined types were placed in this encounter.   Follow-up: Return if symptoms worsen or fail to improve, for Consider diagnostic facet joint block of the upper/ middle facet joints as well as physical therapy..   Procedures: No procedures performed  No notes on file   Clinical History: No specialty comments available.   He reports that he has never smoked. He has never used smokeless tobacco. No results for input(s): HGBA1C, LABURIC in the last 8760 hours.  Objective:  VS:  HT:5\' 9"  (175.3 cm)   WT:194 lb (88 kg)  BMI:28.64    BP:130/77  HR:63bpm  TEMP: ( )  RESP:  Physical Exam Vitals and nursing note reviewed.  Constitutional:      General: He is not in acute distress.    Appearance: Normal appearance. He is well-developed.  HENT:     Head: Normocephalic and atraumatic.  Eyes:     Conjunctiva/sclera: Conjunctivae normal.     Pupils: Pupils are equal, round, and reactive to light.  Cardiovascular:     Rate and Rhythm: Normal rate.     Pulses: Normal pulses.     Heart sounds: Normal heart sounds.  Pulmonary:     Effort: Pulmonary effort is normal. No respiratory distress.  Musculoskeletal:      Cervical back: Neck supple. Tenderness present.     Right lower leg: No edema.     Left lower leg: No edema.     Comments: Examination of the cervical spine shows forward flexed cervical spine with sitting.  He has pain with extension and rotation to the left much more than right with decreased range of motion to the left.  He has focal trigger point in the levator scapula trapezius but does not reproduce all of his pain.  He has mild shoulder impingement sign on the left with external rotation and some on the right.  He has good strength in the upper extremities bilaterally.  He has negative Hoffmann's bilaterally.  He has equivocal Phalen's bilaterally.  This does seem to cause some discomfort but not really frank tingling.  Lymphadenopathy:  Cervical: No cervical adenopathy.  Skin:    General: Skin is warm and dry.     Findings: No erythema or rash.  Neurological:     General: No focal deficit present.     Mental Status: He is alert and oriented to person, place, and time.     Sensory: No sensory deficit.     Coordination: Coordination normal.     Gait: Gait normal.  Psychiatric:        Mood and Affect: Mood normal.        Behavior: Behavior normal.     Ortho Exam Imaging: No results found.  Past Medical/Family/Surgical/Social History: Medications & Allergies reviewed per EMR, new medications updated. Patient Active Problem List   Diagnosis Date Noted  . Coronary disease 06/17/2011  . Dyslipidemia 06/17/2011  . Obesity 06/17/2011   Past Medical History:  Diagnosis Date  . Coronary artery disease    Atherosclerotic CAD  . Dyslipidemia    History of dyslipidemia  . History of arthritis   . Hyperlipidemia   . Obesity   . Plantar fasciitis   . Spinal stenosis    Family History  Problem Relation Age of Onset  . Heart attack Mother   . Heart attack Father 7  . Hypertension Brother   . Cirrhosis Sister    Past Surgical History:  Procedure Laterality Date  .  CARDIAC CATHETERIZATION  08/02/2004   ejection fraction estimated 60%  . CORONARY ARTERY BYPASS GRAFT  1999   x2 by Dr. Melrose Nakayama.[email or web address]  . TONSILLECTOMY     Social History   Occupational History  . Not on file  Tobacco Use  . Smoking status: Never Smoker  . Smokeless tobacco: Never Used  Substance and Sexual Activity  . Alcohol use: No    Alcohol/week: 0.0 standard drinks  . Drug use: No  . Sexual activity: Not on file

## 2019-05-06 DIAGNOSIS — Z1331 Encounter for screening for depression: Secondary | ICD-10-CM | POA: Diagnosis not present

## 2019-05-06 DIAGNOSIS — M545 Low back pain: Secondary | ICD-10-CM | POA: Diagnosis not present

## 2019-05-06 DIAGNOSIS — E669 Obesity, unspecified: Secondary | ICD-10-CM | POA: Diagnosis not present

## 2019-05-06 DIAGNOSIS — M542 Cervicalgia: Secondary | ICD-10-CM | POA: Diagnosis not present

## 2019-05-06 DIAGNOSIS — I2510 Atherosclerotic heart disease of native coronary artery without angina pectoris: Secondary | ICD-10-CM | POA: Diagnosis not present

## 2019-05-06 DIAGNOSIS — I10 Essential (primary) hypertension: Secondary | ICD-10-CM | POA: Diagnosis not present

## 2019-05-06 DIAGNOSIS — R635 Abnormal weight gain: Secondary | ICD-10-CM | POA: Diagnosis not present

## 2019-05-06 DIAGNOSIS — R600 Localized edema: Secondary | ICD-10-CM | POA: Diagnosis not present

## 2019-11-21 ENCOUNTER — Ambulatory Visit: Payer: PPO | Admitting: Cardiology

## 2019-11-21 ENCOUNTER — Encounter: Payer: Self-pay | Admitting: Cardiology

## 2019-11-21 ENCOUNTER — Other Ambulatory Visit: Payer: Self-pay

## 2019-11-21 VITALS — BP 138/68 | HR 60 | Ht 69.0 in | Wt 204.2 lb

## 2019-11-21 DIAGNOSIS — M79605 Pain in left leg: Secondary | ICD-10-CM

## 2019-11-21 DIAGNOSIS — E785 Hyperlipidemia, unspecified: Secondary | ICD-10-CM

## 2019-11-21 DIAGNOSIS — M79604 Pain in right leg: Secondary | ICD-10-CM | POA: Diagnosis not present

## 2019-11-21 DIAGNOSIS — M79606 Pain in leg, unspecified: Secondary | ICD-10-CM | POA: Diagnosis not present

## 2019-11-21 DIAGNOSIS — I4510 Unspecified right bundle-branch block: Secondary | ICD-10-CM | POA: Diagnosis not present

## 2019-11-21 DIAGNOSIS — R079 Chest pain, unspecified: Secondary | ICD-10-CM

## 2019-11-21 DIAGNOSIS — Z951 Presence of aortocoronary bypass graft: Secondary | ICD-10-CM | POA: Diagnosis not present

## 2019-11-21 NOTE — Assessment & Plan Note (Signed)
Its not clear to me this is angina.  I offered a repeat Myoview, he declined.

## 2019-11-21 NOTE — Assessment & Plan Note (Signed)
He has spinal stenosis but also may have PAD- check dopplers

## 2019-11-21 NOTE — Patient Instructions (Signed)
Medication Instructions:  Continue current medications  *If you need a refill on your cardiac medications before your next appointment, please call your pharmacy*   Lab Work: None Ordered   Testing/Procedures: Your physician has requested that you have a lower extremity arterial duplex. This test is an ultrasound of the arteries in the legs. It looks at arterial blood flow in the legs and arms. Allow one hour for Lower and Upper Arterial scans. There are no restrictions or special instructions   Follow-Up: At [facility], you and your health needs are our priority.  As part of our continuing mission to provide you with exceptional heart care, we have created designated Provider Care Teams.  These Care Teams include your primary Cardiologist (physician) and Advanced Practice Providers (APPs -  Physician Assistants and Nurse Practitioners) who all work together to provide you with the care you need, when you need it.  We recommend signing up for the patient portal called "MyChart".  Sign up information is provided on this After Visit Summary.  MyChart is used to connect with patients for Virtual Visits (Telemedicine).  Patients are able to view lab/test results, encounter notes, upcoming appointments, etc.  Non-urgent messages can be sent to your provider as well.   To learn more about what you can do with MyChart, go to [email or web address].    Your next appointment:   1 Year  The format for your next appointment:   In Person  Provider:   You may see Peter [place], MD or one of the following Advanced Practice Providers on your designated Care Team:    Azalee Course, PA-C  Micah Flesher, PA-C or   Judy Pimple, [place]

## 2019-11-21 NOTE — Assessment & Plan Note (Signed)
CABG x 2 '99- LIMA-LAD, SVG-OM Myoview 2017 low risk

## 2019-11-21 NOTE — Assessment & Plan Note (Signed)
Chronic. 

## 2019-11-21 NOTE — Progress Notes (Signed)
Cardiology Office Note:    Date:  11/21/2019   ID:  Gearldine Shown, DOB [birth year], MRN [id number]  PCP:  Jarome Matin, MD  Cardiologist:  Peter Swaziland, MD  Electrophysiologist:  None   Referring MD: Jarome Matin, MD   No chief complaint on file.   History of Present Illness:    Henry Patrick is a 79 y.o. male with a hx of coronary disease, status post CABG x2 in 1999.  He has had a catheterization in 2006 and nuclear stress test in 2012 and 2017 that were low risk.  Other medical problems include essential hypertension and chronic right bundle branch block.  He has spinal stenosis.  He has chronic leg pain.  He is in the office today for routine follow-up.  Patient is somewhat of a difficult historian.  He says he has chest pain "all the time.  Its not clearly exertional or relieved with rest.  He also has chronic leg pain which prevents him from doing much.  He says his legs hurt all the time but are worse when he tries to walk.  He denies any associated dyspnea on exertion or palpitations.  Past Medical History:  Diagnosis Date  . Coronary artery disease    Atherosclerotic CAD  . Dyslipidemia    History of dyslipidemia  . History of arthritis   . Hyperlipidemia   . Obesity   . Plantar fasciitis   . Spinal stenosis     Past Surgical History:  Procedure Laterality Date  . CARDIAC CATHETERIZATION  08/02/2004   ejection fraction estimated 60%  . CORONARY ARTERY BYPASS GRAFT  1999   x2 by Dr. Armida Sans.svg-OM  . TONSILLECTOMY      Current Medications: Current Meds  Medication Sig  . aspirin EC 81 MG tablet Take 1 tablet (81 mg total) by mouth daily.  Marland Kitchen atorvastatin (LIPITOR) 20 MG tablet Take 20 mg by mouth daily.  . Fish Oil-Cholecalciferol (FISH OIL + D3 PO) Take by mouth.  . gabapentin (NEURONTIN) 300 MG capsule Take 300 mg by mouth at bedtime.  Marland Kitchen Lysine 500 MG TABS Take by mouth.  . metoprolol succinate (TOPROL-XL) 25 MG 24 hr tablet Take 25 mg by  mouth daily. for blood pressure  . Multiple Vitamin (MULTI-VITAMIN PO) Take 1 tablet by mouth daily.   . naproxen sodium (ALEVE) 220 MG tablet Take 440 mg by mouth daily.  Marland Kitchen olmesartan (BENICAR) 20 MG tablet Take 20 mg by mouth daily.  . Omega-3 Fatty Acids (FISH OIL PO) Take 1 capsule by mouth daily.      Allergies:   Patient has no known allergies.   Social History   Socioeconomic History  . Marital status: Married    Spouse name: Not on file  . Number of children: Not on file  . Years of education: Not on file  . Highest education level: Not on file  Occupational History  . Not on file  Tobacco Use  . Smoking status: Never Smoker  . Smokeless tobacco: Never Used  Vaping Use  . Vaping Use: Never used  Substance and Sexual Activity  . Alcohol use: No    Alcohol/week: 0.0 standard drinks  . Drug use: No  . Sexual activity: Not on file  Other Topics Concern  . Not on file  Social History Narrative  . Not on file   Social Determinants of Health   Financial Resource Strain:   . Difficulty of Paying Living Expenses: Not on file  Food  Insecurity:   . Worried About Programme researcher, broadcasting/film/video in the Last Year: Not on file  . Ran Out of Food in the Last Year: Not on file  Transportation Needs:   . Lack of Transportation (Medical): Not on file  . Lack of Transportation (Non-Medical): Not on file  Physical Activity:   . Days of Exercise per Week: Not on file  . Minutes of Exercise per Session: Not on file  Stress:   . Feeling of Stress : Not on file  Social Connections:   . Frequency of Communication with Friends and Family: Not on file  . Frequency of Social Gatherings with Friends and Family: Not on file  . Attends Religious Services: Not on file  . Active Member of Clubs or Organizations: Not on file  . Attends Banker Meetings: Not on file  . Marital Status: Not on file     Family History: The patient's family history includes Cirrhosis in his sister; Heart  attack in his mother; Heart attack (age of onset: 98) in his father; Hypertension in his brother.  ROS:   Please see the history of present illness.     All other systems reviewed and are negative.  EKGs/Labs/Other Studies Reviewed:    The following studies were reviewed today: Myoview July 2017- Dr Swaziland reviewed his stress Myoview and compared it to the one in 2012. He did think there was a significant change. The Ecg changes were there previously. No significant ischemia noted on perfusion imaging and EF is normal. He ecommended continued medical therapy.  EKG:  EKG is ordered today.  The ekg ordered today demonstrates NSR, HR 60, RBBB  Recent Labs: No results found for requested labs within last 8760 hours.  Recent Lipid Panel No results found for: CHOL, TRIG, HDL, CHOLHDL, VLDL, LDLCALC, LDLDIRECT  Physical Exam:    VS:  BP 138/68   Pulse 60   Ht 5\' 9"  (1.753 m)   Wt 204 lb 3.2 oz (92.6 kg)   SpO2 95%   BMI 30.16 kg/m     Wt Readings from Last 3 Encounters:  11/21/19 204 lb 3.2 oz (92.6 kg)  02/26/19 194 lb (88 kg)  11/26/18 204 lb (92.5 kg)     GEN: Overweight Caucasian male, well developed in no acute distress HEENT: Normal NECK: No JVD; No carotid bruits CARDIAC: RRR, no murmurs, rubs, gallops RESPIRATORY:  Clear to auscultation without rales, wheezing or rhonchi  ABDOMEN: Soft, non-tender, non-distended MUSCULOSKELETAL:  No edema; No deformity -faint RLE DP pulse, no FA bruits, I could not palpate Rt PT, LT PT, or Lt DP SKIN: Warm and dry NEUROLOGIC:  Alert and oriented x 3 PSYCHIATRIC:  Normal affect   ASSESSMENT:    Chest pain of uncertain etiology Its not clear to me this is angina.  I offered a repeat Myoview, he declined.  Leg pain, bilateral He has spinal stenosis but also may have PAD- check dopplers  Hx of CABG CABG x 2 '99- LIMA-LAD, SVG-OM Myoview 2017 low risk  Dyslipidemia PCP follows  RBBB Chronic  PLAN:    I discussed the signs  and symptoms of classic angina and told him to let 2018 know if these develop.  He did agree to have a LE a Doppler study.  If this is normal and he does well he can follow-up with Dr. [place] in a year.   Medication Adjustments/Labs and Tests Ordered: Current medicines are reviewed at length with the patient today.  Concerns regarding medicines are outlined above.  Orders Placed This Encounter  Procedures  . EKG 12-Lead  . VAS [place] LOWER EXTREMITY ARTERIAL DUPLEX   No orders of the defined types were placed in this encounter.   Patient Instructions  Medication Instructions:  Continue current medications  *If you need a refill on your cardiac medications before your next appointment, please call your pharmacy*   Lab Work: None Ordered   Testing/Procedures: Your physician has requested that you have a lower extremity arterial duplex. This test is an ultrasound of the arteries in the legs. It looks at arterial blood flow in the legs and arms. Allow one hour for Lower and Upper Arterial scans. There are no restrictions or special instructions   Follow-Up: At [facility], you and your health needs are our priority.  As part of our continuing mission to provide you with exceptional heart care, we have created designated Provider Care Teams.  These Care Teams include your primary Cardiologist (physician) and Advanced Practice Providers (APPs -  Physician Assistants and Nurse Practitioners) who all work together to provide you with the care you need, when you need it.  We recommend signing up for the patient portal called "MyChart".  Sign up information is provided on this After Visit Summary.  MyChart is used to connect with patients for Virtual Visits (Telemedicine).  Patients are able to view lab/test results, encounter notes, upcoming appointments, etc.  Non-urgent messages can be sent to your provider as well.   To learn more about what you can do with MyChart, go to  [email or web address].    Your next appointment:   1 Year  The format for your next appointment:   In Person  Provider:   You may see Peter Swaziland, MD or one of the following Advanced Practice Providers on your designated Care Team:    Azalee Course, PA-C  Micah Flesher, PA-C or   Judy Pimple, PA-C        Signed, Corine Shelter, [place]  11/21/2019 9:10 AM    [facility]

## 2019-11-21 NOTE — Assessment & Plan Note (Signed)
PCP follows 

## 2019-12-02 ENCOUNTER — Ambulatory Visit: Payer: PPO | Admitting: Cardiology

## 2019-12-11 ENCOUNTER — Other Ambulatory Visit: Payer: Self-pay | Admitting: Cardiology

## 2019-12-11 DIAGNOSIS — M79606 Pain in leg, unspecified: Secondary | ICD-10-CM

## 2019-12-19 ENCOUNTER — Ambulatory Visit (HOSPITAL_COMMUNITY)
Admission: RE | Admit: 2019-12-19 | Discharge: 2019-12-19 | Disposition: A | Discharge status: Home / Self Care | Payer: PPO | Source: Ambulatory Visit | Attending: Cardiology | Admitting: Cardiology

## 2019-12-19 ENCOUNTER — Other Ambulatory Visit: Payer: Self-pay

## 2019-12-19 DIAGNOSIS — M79606 Pain in leg, unspecified: Secondary | ICD-10-CM | POA: Diagnosis not present

## 2019-12-19 DIAGNOSIS — M79604 Pain in right leg: Secondary | ICD-10-CM | POA: Diagnosis not present

## 2019-12-19 DIAGNOSIS — M79605 Pain in left leg: Secondary | ICD-10-CM

## 2020-02-04 DIAGNOSIS — Z125 Encounter for screening for malignant neoplasm of prostate: Secondary | ICD-10-CM | POA: Diagnosis not present

## 2020-02-04 DIAGNOSIS — E785 Hyperlipidemia, unspecified: Secondary | ICD-10-CM | POA: Diagnosis not present

## 2020-02-24 DIAGNOSIS — E669 Obesity, unspecified: Secondary | ICD-10-CM | POA: Diagnosis not present

## 2020-02-24 DIAGNOSIS — I10 Essential (primary) hypertension: Secondary | ICD-10-CM | POA: Diagnosis not present

## 2020-02-24 DIAGNOSIS — I119 Hypertensive heart disease without heart failure: Secondary | ICD-10-CM | POA: Diagnosis not present

## 2020-02-24 DIAGNOSIS — Z0000 Encounter for general adult medical examination without abnormal findings: Secondary | ICD-10-CM | POA: Diagnosis not present

## 2020-02-24 DIAGNOSIS — E785 Hyperlipidemia, unspecified: Secondary | ICD-10-CM | POA: Diagnosis not present

## 2020-02-24 DIAGNOSIS — I2510 Atherosclerotic heart disease of native coronary artery without angina pectoris: Secondary | ICD-10-CM | POA: Diagnosis not present

## 2020-02-24 DIAGNOSIS — M542 Cervicalgia: Secondary | ICD-10-CM | POA: Diagnosis not present

## 2020-02-24 DIAGNOSIS — M48062 Spinal stenosis, lumbar region with neurogenic claudication: Secondary | ICD-10-CM | POA: Diagnosis not present

## 2020-02-24 DIAGNOSIS — R1312 Dysphagia, oropharyngeal phase: Secondary | ICD-10-CM | POA: Diagnosis not present

## 2020-02-24 DIAGNOSIS — D509 Iron deficiency anemia, unspecified: Secondary | ICD-10-CM | POA: Diagnosis not present

## 2020-02-24 DIAGNOSIS — R82998 Other abnormal findings in urine: Secondary | ICD-10-CM | POA: Diagnosis not present

## 2020-03-04 DIAGNOSIS — Z1212 Encounter for screening for malignant neoplasm of rectum: Secondary | ICD-10-CM | POA: Diagnosis not present

## 2020-04-30 ENCOUNTER — Other Ambulatory Visit: Payer: Self-pay | Admitting: Internal Medicine

## 2020-04-30 DIAGNOSIS — M48062 Spinal stenosis, lumbar region with neurogenic claudication: Secondary | ICD-10-CM

## 2020-05-07 ENCOUNTER — Other Ambulatory Visit: Payer: Self-pay

## 2020-05-07 ENCOUNTER — Ambulatory Visit
Admission: RE | Admit: 2020-05-07 | Discharge: 2020-05-07 | Disposition: A | Discharge status: Home / Self Care | Payer: PPO | Source: Ambulatory Visit | Attending: Internal Medicine | Admitting: Internal Medicine

## 2020-05-07 DIAGNOSIS — M48062 Spinal stenosis, lumbar region with neurogenic claudication: Secondary | ICD-10-CM

## 2020-05-07 DIAGNOSIS — M48061 Spinal stenosis, lumbar region without neurogenic claudication: Secondary | ICD-10-CM | POA: Diagnosis not present

## 2020-05-07 DIAGNOSIS — M545 Low back pain, unspecified: Secondary | ICD-10-CM | POA: Diagnosis not present

## 2020-08-19 ENCOUNTER — Telehealth: Payer: Self-pay | Admitting: *Deleted

## 2020-08-19 DIAGNOSIS — M5412 Radiculopathy, cervical region: Secondary | ICD-10-CM | POA: Diagnosis not present

## 2020-08-19 DIAGNOSIS — M48061 Spinal stenosis, lumbar region without neurogenic claudication: Secondary | ICD-10-CM | POA: Diagnosis not present

## 2020-08-19 DIAGNOSIS — M48062 Spinal stenosis, lumbar region with neurogenic claudication: Secondary | ICD-10-CM | POA: Diagnosis not present

## 2020-08-19 DIAGNOSIS — M5416 Radiculopathy, lumbar region: Secondary | ICD-10-CM | POA: Diagnosis not present

## 2020-08-19 NOTE — Telephone Encounter (Signed)
   [facility] Pre-operative Risk Assessment    Patient Name: Henry Patrick  DOB: 1940-07-24  MRN: [id number]     Request for surgical clearance:  What type of surgery is being performed? L4-5,L5-S1 Lumbar Decompression   When is this surgery scheduled? tbd   What type of clearance is required (medical clearance vs. Pharmacy clearance to hold med vs. Both)? Medical   Are there any medications that need to be held prior to surgery and how long? Aspirin  81 mg  Practice name and name of physician performing surgery? [facility]; Dr Erline Levine   What is the office phone number? [phone]   7.   What is the office fax number? [phone]  8.   Anesthesia type (None, local, MAC, general) ? general   Raiford Simmonds 08/19/2020, 5:38 PM  _________________________________________________________________   (provider comments below)

## 2020-08-20 NOTE — Telephone Encounter (Signed)
   Name: Navon Kotowski Morefield  DOB: 05-Nov-1940  MRN: [id number]   Primary Cardiologist: Peter Swaziland, MD  Chart revisited as part of pre-op coverage. Patient returned call. I reached out to patient for update on how he is doing. The patient affirms he has been doing well without any new cardiac symptoms. He is able to participate in yardwork without angina or dyspnea. Therefore, based on ACC/AHA guidelines, the patient would be at acceptable risk for the planned procedure without further cardiovascular testing.  His main functional limitation is his chronic back problems which is necessitating further intervention as requested below. He reports they are first going to try an injection and if that is unsuccessful, proceed with surgery.  The patient was advised that if he develops new symptoms prior to surgery to contact our office to arrange for a follow-up visit, and he verbalized understanding.  Per Dr. Swaziland, "OK to hold ASA for lumbar surgery."   Will route this bundled recommendation to requesting provider via Epic fax function. Please call with questions.  Laurann Montana, PA-C 08/20/2020, 1:09 PM

## 2020-08-20 NOTE — Telephone Encounter (Signed)
   Name: Henry Patrick  DOB: 1940/02/15  MRN: [id number]   Primary Cardiologist: Peter Swaziland, MD  Chart reviewed as part of pre-operative protocol coverage. Patient was contacted 08/20/2020 in reference to pre-operative risk assessment for pending surgery as outlined below.  Ardon A Canady was last seen on 11/2019 by Corine Shelter. History outlined to include CAD s/p CABGx2 in 1999, spinal stenosis, HTN, RBBB. Stress test in 2017 was abnormal by result but per Dr. Elvis Coil review he did not feel it was changed from 2012 - he felt no significant ischemia and EF was normal. LE arterial studies were normal in 12/2019. Renal function per [facility] showed Cr 1 in 2020. RCRI is 0.9% indicating low risk of CV complications. I reached to patient to assess symptoms but got VM on each number. LMTCB.  In the meantime, will route to Dr. Swaziland on whether OK to hold aspirin as requested for lumbar surgery. No prior clearances on file to reference. Dr. Swaziland - Please route response to P CV DIV PREOP (the pre-op pool). Thank you.   Laurann Montana, PA-C 08/20/2020, 9:27 AM

## 2020-08-20 NOTE — Telephone Encounter (Signed)
OK to hold ASA for lumbar surgery  Nao Linz Swaziland MD, [facility]

## 2020-09-15 DIAGNOSIS — M48062 Spinal stenosis, lumbar region with neurogenic claudication: Secondary | ICD-10-CM | POA: Diagnosis not present

## 2020-11-23 DIAGNOSIS — M4120 Other idiopathic scoliosis, site unspecified: Secondary | ICD-10-CM | POA: Diagnosis not present

## 2020-11-23 DIAGNOSIS — M48061 Spinal stenosis, lumbar region without neurogenic claudication: Secondary | ICD-10-CM | POA: Diagnosis not present

## 2020-11-23 DIAGNOSIS — M545 Low back pain, unspecified: Secondary | ICD-10-CM | POA: Diagnosis not present

## 2020-11-23 DIAGNOSIS — M5416 Radiculopathy, lumbar region: Secondary | ICD-10-CM | POA: Diagnosis not present

## 2020-11-24 DIAGNOSIS — R0902 Hypoxemia: Secondary | ICD-10-CM | POA: Diagnosis not present

## 2020-12-14 DIAGNOSIS — M5416 Radiculopathy, lumbar region: Secondary | ICD-10-CM | POA: Diagnosis not present

## 2020-12-30 DIAGNOSIS — I10 Essential (primary) hypertension: Secondary | ICD-10-CM | POA: Diagnosis not present

## 2020-12-30 DIAGNOSIS — M5412 Radiculopathy, cervical region: Secondary | ICD-10-CM | POA: Diagnosis not present

## 2020-12-30 DIAGNOSIS — M48061 Spinal stenosis, lumbar region without neurogenic claudication: Secondary | ICD-10-CM | POA: Diagnosis not present

## 2021-01-12 ENCOUNTER — Ambulatory Visit: Payer: PPO | Attending: Internal Medicine

## 2021-01-12 ENCOUNTER — Other Ambulatory Visit (HOSPITAL_BASED_OUTPATIENT_CLINIC_OR_DEPARTMENT_OTHER): Payer: Self-pay

## 2021-01-12 DIAGNOSIS — Z23 Encounter for immunization: Secondary | ICD-10-CM

## 2021-01-12 MED ORDER — MODERNA COVID-19 BIVAL BOOSTER 50 MCG/0.5ML IM SUSP
INTRAMUSCULAR | 0 refills | Status: DC
  Filled 2021-01-12: qty 0.5, 1d supply, fill #0

## 2021-01-12 NOTE — Progress Notes (Signed)
   Covid-19 Vaccination Clinic  Name:  Henry Patrick    MRN: [id number] DOB: 1940/08/02  01/12/2021  Henry Patrick was observed post Covid-19 immunization for 15 minutes without incident. He was provided with Vaccine Information Sheet and instruction to access the V-Safe system.   Henry Patrick was instructed to call 911 with any severe reactions post vaccine: Difficulty breathing  Swelling of face and throat  A fast heartbeat  A bad rash all over body  Dizziness and weakness   Immunizations Administered     Name Date Dose VIS Date Route   Moderna Covid-19 vaccine Bivalent Booster 01/12/2021  2:30 PM 0.5 mL 09/26/2020 Intramuscular   Manufacturer: Moderna   Lot: [id number]   NDC: 33354-562-56

## 2021-01-20 DIAGNOSIS — M5412 Radiculopathy, cervical region: Secondary | ICD-10-CM | POA: Diagnosis not present

## 2021-01-20 DIAGNOSIS — M2578 Osteophyte, vertebrae: Secondary | ICD-10-CM | POA: Diagnosis not present

## 2021-01-20 DIAGNOSIS — M542 Cervicalgia: Secondary | ICD-10-CM | POA: Diagnosis not present

## 2021-01-20 DIAGNOSIS — R202 Paresthesia of skin: Secondary | ICD-10-CM | POA: Diagnosis not present

## 2021-01-20 DIAGNOSIS — R200 Anesthesia of skin: Secondary | ICD-10-CM | POA: Diagnosis not present

## 2021-01-22 NOTE — Progress Notes (Signed)
Ethel Rana Dimare Date of Birth: Oct 31, 1940 Medical Record #[id number]  History of Present Illness: [name] is seen today for follow up CAD. He has a history of coronary disease and is status post CABG in 1999. This included an LIMA graft to the LAD and a saphenous vein graft to the obtuse marginal vessel. Cath in 2006 showed grafts were patent. Myoview study in April 2012 was normal. Repeat Myoview in July 2017 was felt to be unchanged. He also has a history of dyslipidemia and obesity.    He has continued problems with his spinal stenosis. His major complaint is of leg and back pain. He does have spinal stenosis and neuropathy.  Now seeing Dr Jake Samples. Planning for possible surgery in the new year. MRI of the C spine is pending.  His activity has been limited but he does note he gets chest pain and SOB when he works in the yard that goes away when he rests. Hasn't used Ntg.   Current Outpatient Medications on File Prior to Visit  Medication Sig Dispense Refill   aspirin EC 81 MG tablet Take 1 tablet (81 mg total) by mouth daily. 90 tablet 3   Bacillus Coagulans-Inulin (ALIGN PREBIOTIC-PROBIOTIC PO) Take by mouth.     COVID-19 mRNA bivalent vaccine, Moderna, (MODERNA COVID-19 BIVAL BOOSTER) 50 MCG/0.5ML injection Inject into the muscle. 0.5 mL 0   Fish Oil-Cholecalciferol (FISH OIL + D3 PO) Take by mouth.     gabapentin (NEURONTIN) 300 MG capsule Take 300 mg by mouth at bedtime.     Lysine 500 MG TABS Take by mouth.     metoprolol succinate (TOPROL-XL) 25 MG 24 hr tablet Take 25 mg by mouth daily. for blood pressure  0   Multiple Vitamin (MULTI-VITAMIN PO) Take 1 tablet by mouth daily.      naproxen sodium (ALEVE) 220 MG tablet Take 440 mg by mouth daily.     olmesartan (BENICAR) 20 MG tablet Take 20 mg by mouth daily.     No current facility-administered medications on file prior to visit.    No Known Allergies  Past Medical History:  Diagnosis Date   Coronary artery disease     Atherosclerotic CAD   Dyslipidemia    History of dyslipidemia   History of arthritis    Hyperlipidemia    Obesity    Plantar fasciitis    Spinal stenosis     Past Surgical History:  Procedure Laterality Date   CARDIAC CATHETERIZATION  08/02/2004   ejection fraction estimated 60%   CORONARY ARTERY BYPASS GRAFT  1999   x2 by Dr. Armida Sans.svg-OM   TONSILLECTOMY      Social History   Tobacco Use  Smoking Status Never  Smokeless Tobacco Never    Social History   Substance and Sexual Activity  Alcohol Use No   Alcohol/week: 0.0 standard drinks    Family History  Problem Relation Age of Onset   Heart attack Mother    Heart attack Father 52   Hypertension Brother    Cirrhosis Sister     Review of Systems: As noted in HPI.  All other systems were reviewed and are negative.  Physical Exam: BP 122/70 (BP Location: Left Arm, Patient Position: Sitting, Cuff Size: Normal)   Pulse (!) 57   Resp 20   Ht 5\' 9"  (1.753 m)   Wt 197 lb 3.2 oz (89.4 kg)   SpO2 98%   BMI 29.12 kg/m  GENERAL:  Well appearing obese WM in NAD  HEENT:  PERRL, EOMI, sclera are clear. Oropharynx is clear. NECK:  No jugular venous distention, carotid upstroke brisk and symmetric, no bruits, no thyromegaly or adenopathy LUNGS:  Clear to auscultation bilaterally CHEST:  Unremarkable HEART:  RRR,  PMI not displaced or sustained,S1 and S2 within normal limits, no S3, no S4: no clicks, no rubs, no murmurs ABD:  Soft, nontender. BS +, no masses or bruits. No hepatomegaly, no splenomegaly EXT:  2 + pulses throughout, no edema, no cyanosis no clubbing SKIN:  Warm and dry.  No rashes NEURO:  Alert and oriented x 3. Cranial nerves II through XII intact. PSYCH:  Cognitively intact      LABORATORY DATA:  Labs dated 12/22/15: cholesterol 139, triglycerides 121, HDL 37, LDL 78. CBC, chemistries and TSH normal. Dated 12/28/16: cholesterol 151, triglycerides 147, HDL 39, LDL 83. Chemistries and hgb  normal.  Dated 01/15/18: cholesterol 140, triglycerides 124, HDL 38, LDL 77.  Dated 02/21/18: creatinine 1.15.  Dated 04/25/18: normal Hgb.  Dated 02/04/20: cholesterol 144, triglycerides 139, HDL 37, LDL 81. CMET and TSH normal.  ECG today shows normal sinus rhythm with first degree AV block. RBBB. Unchanged. Rate 57. I have personally reviewed and interpreted this study.   Myoview 08/21/15:  Notes Recorded by Arminda Foglio M Swaziland, MD on 08/21/2015 at 2:36 PM I reviewed his stress Myoview and compared it to the one in 2012. I really don't think there is a significant change. The Ecg changes were there previously. No significant ischemia noted on perfusion imaging and EF is normal. Recommend continued medical therapy.  Teryl Gubler Swaziland MD, [facility]        Vitals   Height Weight BMI (Calculated)  5\' 9"  (1.753 m) 214 lb (97.1 kg) 31.7  Study Highlights   Nuclear stress EF: 56%. The left ventricular ejection fraction is normal (55-65%). Horizontal ST segment depression of 1 mm was noted during stress in the II, III, aVF, V5 and V6 leads. In recovery there was downsloping of the ST segments with T wave inversions in the same leads. There is a medium defect of moderate severity present in the apical anterior, apical lateral and apex location. The defect non-reversible. This is a high risk study due to ischemic ST changes noted on stress EKG as well as borderline TID of 1.21 which may indicate multivessel CAD.     Normal LE arterial dopplers in Nov 2021   Assessment / Plan: 1. CAD s/p CABG in 1999. Normal Myoview in July 2017. He does have symptoms of angina class 2. Continue ASA and statin therapy. On beta blocker. With anginal symptoms and need for possible spine surgery will arrange for a Lexiscan Myoview. If abnormal will need cardiac cath.   2. Hyperlipidemia. Ideally would like LDL < 70. LDLs have consistently been in the 80s. Will increase lipitor to 40 mg daily.   3. RBBB chronic.  Benign.   4.  Family history of AAA. August 2017 in July 2021 was negative

## 2021-01-26 ENCOUNTER — Ambulatory Visit: Payer: PPO | Admitting: Cardiology

## 2021-01-26 ENCOUNTER — Other Ambulatory Visit: Payer: Self-pay

## 2021-01-26 ENCOUNTER — Telehealth (HOSPITAL_COMMUNITY): Payer: Self-pay | Admitting: *Deleted

## 2021-01-26 ENCOUNTER — Encounter: Payer: Self-pay | Admitting: Cardiology

## 2021-01-26 VITALS — BP 122/70 | HR 57 | Resp 20 | Ht 69.0 in | Wt 197.2 lb

## 2021-01-26 DIAGNOSIS — E7800 Pure hypercholesterolemia, unspecified: Secondary | ICD-10-CM | POA: Diagnosis not present

## 2021-01-26 DIAGNOSIS — I4510 Unspecified right bundle-branch block: Secondary | ICD-10-CM

## 2021-01-26 DIAGNOSIS — Z951 Presence of aortocoronary bypass graft: Secondary | ICD-10-CM

## 2021-01-26 DIAGNOSIS — I25708 Atherosclerosis of coronary artery bypass graft(s), unspecified, with other forms of angina pectoris: Secondary | ICD-10-CM | POA: Diagnosis not present

## 2021-01-26 MED ORDER — ATORVASTATIN CALCIUM 40 MG PO TABS: 40.0000 mg | ORAL_TABLET | Freq: Every day | ORAL | 3 refills | Status: DC

## 2021-01-26 NOTE — Addendum Note (Signed)
Addended by: Neoma Laming on: 01/26/2021 08:37 AM   Modules accepted: Orders

## 2021-01-26 NOTE — Patient Instructions (Addendum)
We will schedule you for a nuclear stress test ( Lexiscan )    Follow up with Dr.Jordan in 6 months

## 2021-01-26 NOTE — Telephone Encounter (Signed)
Close encounter 

## 2021-01-28 ENCOUNTER — Other Ambulatory Visit: Payer: Self-pay

## 2021-01-28 ENCOUNTER — Ambulatory Visit (HOSPITAL_COMMUNITY)
Admission: RE | Admit: 2021-01-28 | Discharge: 2021-01-28 | Disposition: A | Discharge status: Home / Self Care | Payer: PPO | Source: Ambulatory Visit | Attending: Cardiology | Admitting: Cardiology

## 2021-01-28 DIAGNOSIS — I4510 Unspecified right bundle-branch block: Secondary | ICD-10-CM | POA: Insufficient documentation

## 2021-01-28 DIAGNOSIS — I25708 Atherosclerosis of coronary artery bypass graft(s), unspecified, with other forms of angina pectoris: Secondary | ICD-10-CM

## 2021-01-28 DIAGNOSIS — Z951 Presence of aortocoronary bypass graft: Secondary | ICD-10-CM

## 2021-01-28 DIAGNOSIS — E7800 Pure hypercholesterolemia, unspecified: Secondary | ICD-10-CM | POA: Diagnosis not present

## 2021-01-28 LAB — MYOCARDIAL PERFUSION IMAGING
LV dias vol: 111 mL (ref 62–150)
LV sys vol: 43 mL
Nuc Stress EF: 61 %
Peak HR: 73 {beats}/min
Rest HR: 62 {beats}/min
Rest Nuclear Isotope Dose: 10.8 mCi
SDS: 4
SRS: 1
SSS: 5
ST Depression (mm): 0 mm
Stress Nuclear Isotope Dose: 31.1 mCi
TID: 1.16

## 2021-01-28 MED ORDER — REGADENOSON 0.4 MG/5ML IV SOLN
0.4000 mg | Freq: Once | INTRAVENOUS | Status: AC
  Administered 2021-01-28: 0.4 mg via INTRAVENOUS

## 2021-01-28 MED ORDER — TECHNETIUM TC 99M TETROFOSMIN IV KIT
10.8000 | PACK | Freq: Once | INTRAVENOUS | Status: AC | PRN
  Administered 2021-01-28: 10.8 via INTRAVENOUS
  Filled 2021-01-28: qty 11

## 2021-01-28 MED ORDER — TECHNETIUM TC 99M TETROFOSMIN IV KIT
31.1000 | PACK | Freq: Once | INTRAVENOUS | Status: AC | PRN
  Administered 2021-01-28: 31.1 via INTRAVENOUS
  Filled 2021-01-28: qty 32

## 2021-01-29 ENCOUNTER — Encounter: Payer: Self-pay | Admitting: Cardiology

## 2021-02-16 ENCOUNTER — Ambulatory Visit: Payer: PPO | Admitting: Cardiology

## 2021-03-10 DIAGNOSIS — M4802 Spinal stenosis, cervical region: Secondary | ICD-10-CM | POA: Diagnosis not present

## 2021-03-10 DIAGNOSIS — M48061 Spinal stenosis, lumbar region without neurogenic claudication: Secondary | ICD-10-CM | POA: Diagnosis not present

## 2021-03-11 ENCOUNTER — Telehealth (HOSPITAL_BASED_OUTPATIENT_CLINIC_OR_DEPARTMENT_OTHER): Payer: Self-pay | Admitting: *Deleted

## 2021-03-11 NOTE — Telephone Encounter (Signed)
° ° °  Patient Name: Henry Patrick  DOB: Jan 29, 1941 MRN: [id number]  Primary Cardiologist: Peter Swaziland, MD  Chart reviewed as part of pre-operative protocol coverage. Given past medical history and time since last visit, based on ACC/AHA guidelines, Cathy A Corado would be at acceptable risk for the planned procedure without further cardiovascular testing. Recent stress test was low risk.   Dr. Swaziland, can patient hold aspirin or continue during pre-op period given hx of CABG? Please forward your response to P CV DIV PREOP.   Thank you   Manson Passey, PA 03/11/2021, 3:50 PM

## 2021-03-11 NOTE — Telephone Encounter (Signed)
° °  Pre-operative Risk Assessment    Patient Name: Henry Patrick  DOB: 15-Aug-1940 MRN: [id number]      Request for Surgical Clearance    Procedure:   LUMBAR LAMINECTOMY   Date of Surgery:  Clearance TBD                                  Surgeon:  DR Monia Pouch Surgeon's Group or Practice Name:  [facility]  Phone number:  [phone]  Fax number:  [phone]   Type of Clearance Requested:   - Medical  - Pharmacy:  Hold Aspirin     Type of Anesthesia:  General    Additional requests/questions:    SignedRegis Bill   03/11/2021, 2:32 PM

## 2021-03-12 ENCOUNTER — Other Ambulatory Visit: Payer: Self-pay | Admitting: Neurological Surgery

## 2021-03-12 NOTE — Telephone Encounter (Signed)
He may hold ASA for spine procedure  Florie Carico [place] MD, [facility]

## 2021-03-25 ENCOUNTER — Other Ambulatory Visit: Payer: Self-pay | Admitting: Neurological Surgery

## 2021-04-01 NOTE — Progress Notes (Signed)
Surgical Instructions   Your procedure is scheduled on Tuesday 04/06/2021.  Report to [facility] Main Entrance "A" at 05:30 A.M., then check in with the Admitting office.  Call [phone] if you have problems or questions between now and the morning of surgery:   Remember: Do not eat or drink after midnight the night before your surgery    Take these medicines the morning of surgery with A SIP OF WATER:  Atrovastatin (Lipitor) Metoprolol succinate (Toprol-XL)   If needed you may take these medications the morning of surgery: Acetaminophen (Tylenol)  Follow your surgeon's instructions on when to stop Aspirin.  If no instructions were given by your surgeon then you will need to call the office to get those instructions.    As of today, STOP taking any Aspirin (unless otherwise instructed by your surgeon) or Aspirin-containing products; NSAIDS - Aleve, Naproxen, Ibuprofen, Motrin, Advil, Goody's, BC's, all herbal medications, fish oil, and all vitamins.   After your pre-procedure COVID test  You are not required to quarantine however you are required to wear a well-fitting mask when you are out and around people not in your household.  If your mask becomes wet or soiled, replace with a new one.  Wash your hands often with soap and water for 20 seconds or clean your hands with an alcohol-based hand sanitizer that contains at least 60% alcohol.  Do not share personal items.  Notify your provider: if you are in close contact with someone who has COVID  or if you develop a fever of 100.4 or greater, sneezing, cough, sore throat, shortness of breath or body aches.          Do not wear jewelry or makeup  Do not wear lotions, powders, perfumes, or deodorant.  Do not shave 48 hours prior to surgery.  Do not wear nail polish, gel polish, artificial nails, or any other type of covering on natural nails including fingernails and toenails. If patients have artificial nails, gel  coating, etc. that need to be removed by a nail salon please have this removed prior to surgery or surgery may need to be canceled/delayed if the surgeon/ anesthesia feels like the patient is unable to be adequately monitored.  Do not bring valuables to the hospital - [facility] is not responsible for any belongings or valuables.  Do NOT Smoke (Tobacco/Vaping) or drink Alcohol 24 hours prior to your procedure  If you use a CPAP at night, please bring your mask for your overnight stay.   Contacts, glasses, hearing aids, dentures or partials may not be worn into surgery, please bring cases for these belongings   For patients admitted to the hospital, discharge time will be determined by your treatment team.   Patients discharged the day of surgery will not be allowed to drive home, and someone needs to stay with them for 24 hours.  NO VISITORS WILL BE ALLOWED IN PRE-OP WHERE PATIENTS ARE PREPPED FOR SURGERY.  ONLY 1 SUPPORT PERSON MAY BE PRESENT IN THE WAITING ROOM WHILE YOU ARE IN SURGERY.  IF YOU ARE TO BE ADMITTED, ONCE YOU ARE IN YOUR ROOM YOU WILL BE ALLOWED TWO (2) VISITORS. 1 (ONE) VISITOR MAY STAY OVERNIGHT BUT MUST ARRIVE TO THE ROOM BY 8pm.  Minor children may have two parents present. Special consideration for safety and communication needs will be reviewed on a case by case basis.  Special instructions:    Oral Hygiene is also important to reduce your risk of infection.  Remember - BRUSH YOUR TEETH THE MORNING OF SURGERY WITH YOUR REGULAR TOOTHPASTE   [facility]- Preparing For Surgery  Before surgery, you can play an important role. Because skin is not sterile, your skin needs to be as free of germs as possible. You can reduce the number of germs on your skin by washing with CHG (chlorahexidine gluconate) Soap before surgery.  CHG is an antiseptic cleaner which kills germs and bonds with the skin to continue killing germs even after washing.     Please do not use if you have an  allergy to CHG or antibacterial soaps. If your skin becomes reddened/irritated stop using the CHG.  Do not shave (including legs and underarms) for at least 48 hours prior to first CHG shower. It is OK to shave your face.  Please follow these instructions carefully.     Shower the NIGHT BEFORE SURGERY and the MORNING OF SURGERY with CHG Soap.   If you chose to wash your hair, wash your hair first as usual with your normal shampoo. After you shampoo, rinse your hair and body thoroughly to remove the shampoo.    Then [employer] and genitals (private parts) with your normal soap and rinse thoroughly to remove soap.  Next use the CHG Soap as you would any other liquid soap. You can apply CHG directly to the skin and wash gently with a clean washcloth.   Apply the CHG Soap to your body ONLY FROM THE NECK DOWN.  Do not use on open wounds or open sores. Avoid contact with your eyes, ears, mouth and genitals (private parts). Wash Face and genitals (private parts)  with your normal soap.   Wash thoroughly, paying special attention to the area where your surgery will be performed.  Thoroughly rinse your body with warm water from the neck down.  DO NOT shower/wash with your normal soap after using and rinsing off the CHG Soap.  Pat yourself dry with a CLEAN TOWEL.  Wear CLEAN PAJAMAS to bed the night before surgery  Place CLEAN SHEETS on your bed the night before your surgery  DO NOT SLEEP WITH PETS.   Day of Surgery:  Take a shower with CHG soap. Wear Clean/Comfortable clothing the morning of surgery Do not apply any deodorants/lotions.   Remember to brush your teeth WITH YOUR REGULAR TOOTHPASTE.   Please read over the fact sheets that you were given.

## 2021-04-02 ENCOUNTER — Encounter (HOSPITAL_COMMUNITY)
Admission: RE | Admit: 2021-04-02 | Discharge: 2021-04-02 | Disposition: A | Discharge status: Home / Self Care | Payer: PPO | Source: Ambulatory Visit | Attending: Neurological Surgery | Admitting: Neurological Surgery

## 2021-04-02 ENCOUNTER — Encounter (HOSPITAL_COMMUNITY): Payer: Self-pay

## 2021-04-02 ENCOUNTER — Other Ambulatory Visit: Payer: Self-pay

## 2021-04-02 VITALS — BP 128/73 | HR 63 | Temp 98.2°F | Resp 17 | Ht 69.0 in | Wt 202.4 lb

## 2021-04-02 DIAGNOSIS — I440 Atrioventricular block, first degree: Secondary | ICD-10-CM | POA: Diagnosis not present

## 2021-04-02 DIAGNOSIS — D649 Anemia, unspecified: Secondary | ICD-10-CM | POA: Diagnosis not present

## 2021-04-02 DIAGNOSIS — I4510 Unspecified right bundle-branch block: Secondary | ICD-10-CM | POA: Diagnosis not present

## 2021-04-02 DIAGNOSIS — R001 Bradycardia, unspecified: Secondary | ICD-10-CM | POA: Diagnosis not present

## 2021-04-02 DIAGNOSIS — Z01818 Encounter for other preprocedural examination: Secondary | ICD-10-CM

## 2021-04-02 DIAGNOSIS — Z01812 Encounter for preprocedural laboratory examination: Secondary | ICD-10-CM | POA: Diagnosis not present

## 2021-04-02 DIAGNOSIS — Z20822 Contact with and (suspected) exposure to covid-19: Secondary | ICD-10-CM | POA: Insufficient documentation

## 2021-04-02 DIAGNOSIS — R944 Abnormal results of kidney function studies: Secondary | ICD-10-CM | POA: Diagnosis not present

## 2021-04-02 DIAGNOSIS — E785 Hyperlipidemia, unspecified: Secondary | ICD-10-CM | POA: Diagnosis not present

## 2021-04-02 DIAGNOSIS — I2510 Atherosclerotic heart disease of native coronary artery without angina pectoris: Secondary | ICD-10-CM | POA: Insufficient documentation

## 2021-04-02 DIAGNOSIS — Z951 Presence of aortocoronary bypass graft: Secondary | ICD-10-CM | POA: Insufficient documentation

## 2021-04-02 LAB — BASIC METABOLIC PANEL
Anion gap: 7 (ref 5–15)
BUN: 20 mg/dL (ref 8–23)
CO2: 26 mmol/L (ref 22–32)
Calcium: 9.2 mg/dL (ref 8.9–10.3)
Chloride: 107 mmol/L (ref 98–111)
Creatinine, Ser: 1.27 mg/dL — ABNORMAL HIGH (ref 0.61–1.24)
GFR, Estimated: 56 mL/min — ABNORMAL LOW (ref >60)
Glucose, Bld: 115 mg/dL — ABNORMAL HIGH (ref 70–99)
Potassium: 4.7 mmol/L (ref 3.5–5.1)
Sodium: 140 mmol/L (ref 135–145)

## 2021-04-02 LAB — CBC
HCT: 40 % (ref 39.0–52.0)
Hemoglobin: 12.6 g/dL — ABNORMAL LOW (ref 13.0–17.0)
MCH: 30 pg (ref 26.0–34.0)
MCHC: 31.5 g/dL (ref 30.0–36.0)
MCV: 95.2 fL (ref 80.0–100.0)
Platelets: 182 10*3/uL (ref 150–400)
RBC: 4.2 MIL/uL — ABNORMAL LOW (ref 4.22–5.81)
RDW: 13.6 % (ref 11.5–15.5)
WBC: 6.2 10*3/uL (ref 4.0–10.5)
nRBC: 0 % (ref 0.0–0.2)

## 2021-04-02 LAB — SURGICAL PCR SCREEN
MRSA, PCR: NEGATIVE
Staphylococcus aureus: NEGATIVE

## 2021-04-02 LAB — SARS CORONAVIRUS 2 (TAT 6-24 HRS): SARS Coronavirus 2: NEGATIVE

## 2021-04-02 NOTE — Progress Notes (Signed)
PCP: Leanna Battles, MD Cardiologist: Peter Martinique, MD  EKG: 01/26/21 CXR: na ECHO: denies Stress Test: 01/28/21 Cardiac Cath: 1999 at [facility]  Fasting Blood Sugar- na Checks Blood Sugar__na_ times a day  OSA/CPAP: No  ASA: Last dose 03/21/21 Blood Thinner: No  Covid test 04/02/21 at PAT  Anesthesia Review: Yes, cardiac history  Patient denies shortness of breath, fever, cough, and chest pain at PAT appointment.  Patient verbalized understanding of instructions provided today at the PAT appointment.  Patient asked to review instructions at home and day of surgery.

## 2021-04-05 NOTE — Anesthesia Preprocedure Evaluation (Addendum)
Anesthesia Evaluation  Patient identified by MRN, date of birth, ID band Patient awake    Reviewed: Allergy & Precautions, NPO status , Patient's Chart, lab work & pertinent test results, reviewed documented beta blocker date and time   History of Anesthesia Complications Negative for: history of anesthetic complications  Airway Mallampati: II  TM Distance: >3 FB Neck ROM: Full    Dental  (+) Dental Advisory Given   Pulmonary neg pulmonary ROS,  04/02/2021 SARS coronavirus NEG   breath sounds clear to auscultation       Cardiovascular hypertension, Pt. on medications and Pt. on home beta blockers (-) angina+ CAD and + CABG   Rhythm:Regular Rate:Normal  01/2021 Stress: normal LVF and perfusion, EF 61%   Neuro/Psych Back pain    GI/Hepatic negative GI ROS, Neg liver ROS,   Endo/Other  negative endocrine ROS  Renal/GU negative Renal ROS     Musculoskeletal   Abdominal   Peds  Hematology negative hematology ROS (+)   Anesthesia Other Findings   Reproductive/Obstetrics                            Anesthesia Physical Anesthesia Plan  ASA: 3  Anesthesia Plan: General   Post-op Pain Management: Tylenol PO (pre-op)*   Induction: Intravenous  PONV Risk Score and Plan: 2 and Ondansetron and Dexamethasone  Airway Management Planned: Oral ETT  Additional Equipment: None  Intra-op Plan:   Post-operative Plan: Extubation in OR  Informed Consent: I have reviewed the patients History and Physical, chart, labs and discussed the procedure including the risks, benefits and alternatives for the proposed anesthesia with the patient or authorized representative who has indicated his/her understanding and acceptance.     Dental advisory given  Plan Discussed with: CRNA and Surgeon  Anesthesia Plan Comments: (PAT note by Antionette Poles, PA-C: Follows cardiology for history of right bundle branch  block, HLD, CAD s/p CABG 1999.  Last seen by Dr. Swaz[place]and 01/26/2021 and discussed upcoming spine surgery.  Nuclear stress test was ordered.  Stress test 01/28/2021 was low risk.  Patient was subsequently cleared for surgery per telephone encounter 03/11/2021 and advised he may hold ASA for procedure.   Preop labs reviewed, creatinine mildly elevated 1.27, mild anemia with hemoglobin 12.6, otherwise unremarkable.  EKG 01/26/2021: Sinus bradycardia with first-degree AV block.  Rate 57.  Right bundle branch block.  Nuclear stress 01/28/2021: The study is normal. Findings are consistent with no prior ischemia. The study is low risk.  No ST deviation was noted.  Left ventricular function is normal. Nuclear stress EF: 61 %. The left ventricular ejection fraction is normal (55-65%). End diastolic cavity size is normal.  Prior study available for comparison from 08/21/2015.  )      Anesthesia Quick Evaluation

## 2021-04-05 NOTE — Progress Notes (Signed)
Anesthesia Chart Review:  Follows cardiology for history of right bundle branch block, HLD, CAD s/p CABG 1999.  Last seen by Dr. Swaziland 01/26/2021 and discussed upcoming spine surgery.  Nuclear stress test was ordered.  Stress test 01/28/2021 was low risk.  Patient was subsequently cleared for surgery per telephone encounter 03/11/2021 and advised he may hold ASA for procedure.   Preop labs reviewed, creatinine mildly elevated 1.27, mild anemia with hemoglobin 12.6, otherwise unremarkable.  EKG 01/26/2021: Sinus bradycardia with first-degree AV block.  Rate 57.  Right bundle branch block.  Nuclear stress 01/28/2021:  The study is normal. Findings are consistent with no prior ischemia. The study is low risk.   No ST deviation was noted.   Left ventricular function is normal. Nuclear stress EF: 61 %. The left ventricular ejection fraction is normal (55-65%). End diastolic cavity size is normal.   Prior study available for comparison from 08/21/2015.   Henry Patrick [facility] Short Stay Center/Anesthesiology Phone [phone] 04/05/2021 10:07 AM

## 2021-04-06 ENCOUNTER — Ambulatory Visit (HOSPITAL_COMMUNITY): Payer: PPO

## 2021-04-06 ENCOUNTER — Ambulatory Visit (HOSPITAL_BASED_OUTPATIENT_CLINIC_OR_DEPARTMENT_OTHER): Payer: PPO | Admitting: Anesthesiology

## 2021-04-06 ENCOUNTER — Observation Stay (HOSPITAL_COMMUNITY)
Admission: RE | Admit: 2021-04-06 | Discharge: 2021-04-07 | Disposition: A | Discharge status: Home / Self Care | Payer: PPO | Attending: Neurological Surgery | Admitting: Neurological Surgery

## 2021-04-06 ENCOUNTER — Other Ambulatory Visit: Payer: Self-pay

## 2021-04-06 ENCOUNTER — Encounter (HOSPITAL_COMMUNITY)
Admission: RE | Disposition: A | Discharge status: Home / Self Care | Payer: Self-pay | Source: Home / Self Care | Attending: Neurological Surgery

## 2021-04-06 ENCOUNTER — Encounter (HOSPITAL_COMMUNITY): Payer: Self-pay | Admitting: Neurological Surgery

## 2021-04-06 ENCOUNTER — Ambulatory Visit (HOSPITAL_COMMUNITY): Payer: PPO | Admitting: Physician Assistant

## 2021-04-06 DIAGNOSIS — I10 Essential (primary) hypertension: Secondary | ICD-10-CM

## 2021-04-06 DIAGNOSIS — I2510 Atherosclerotic heart disease of native coronary artery without angina pectoris: Secondary | ICD-10-CM | POA: Diagnosis not present

## 2021-04-06 DIAGNOSIS — M48062 Spinal stenosis, lumbar region with neurogenic claudication: Secondary | ICD-10-CM | POA: Diagnosis not present

## 2021-04-06 DIAGNOSIS — Z7982 Long term (current) use of aspirin: Secondary | ICD-10-CM | POA: Insufficient documentation

## 2021-04-06 DIAGNOSIS — Z981 Arthrodesis status: Secondary | ICD-10-CM | POA: Diagnosis not present

## 2021-04-06 DIAGNOSIS — Z419 Encounter for procedure for purposes other than remedying health state, unspecified: Secondary | ICD-10-CM

## 2021-04-06 HISTORY — PX: LUMBAR LAMINECTOMY/DECOMPRESSION MICRODISCECTOMY: SHX5026

## 2021-04-06 SURGERY — LUMBAR LAMINECTOMY/DECOMPRESSION MICRODISCECTOMY 2 LEVELS
Anesthesia: General | Site: Back

## 2021-04-06 MED ORDER — EPHEDRINE 5 MG/ML INJ
INTRAVENOUS | Status: AC
  Filled 2021-04-06: qty 5

## 2021-04-06 MED ORDER — FENTANYL CITRATE (PF) 100 MCG/2ML IJ SOLN
INTRAMUSCULAR | Status: DC | PRN
  Administered 2021-04-06: 50 ug via INTRAVENOUS

## 2021-04-06 MED ORDER — METHYLPREDNISOLONE ACETATE 80 MG/ML IJ SUSP
INTRAMUSCULAR | Status: DC | PRN
  Administered 2021-04-06: 40 mg

## 2021-04-06 MED ORDER — ACETAMINOPHEN 325 MG PO TABS: 650.0000 mg | ORAL_TABLET | ORAL | Status: DC | PRN

## 2021-04-06 MED ORDER — FENTANYL CITRATE (PF) 250 MCG/5ML IJ SOLN
INTRAMUSCULAR | Status: DC | PRN
  Administered 2021-04-06 (×2): 50 ug via INTRAVENOUS
  Administered 2021-04-06: 100 ug via INTRAVENOUS
  Administered 2021-04-06: 50 ug via INTRAVENOUS

## 2021-04-06 MED ORDER — ROCURONIUM BROMIDE 10 MG/ML (PF) SYRINGE
PREFILLED_SYRINGE | INTRAVENOUS | Status: DC | PRN
  Administered 2021-04-06: 30 mg via INTRAVENOUS
  Administered 2021-04-06: 70 mg via INTRAVENOUS

## 2021-04-06 MED ORDER — CEFAZOLIN SODIUM-DEXTROSE 2-4 GM/100ML-% IV SOLN
2.0000 g | INTRAVENOUS | Status: AC
  Administered 2021-04-06: 2 g via INTRAVENOUS
  Filled 2021-04-06: qty 100

## 2021-04-06 MED ORDER — PHENYLEPHRINE 40 MCG/ML (10ML) SYRINGE FOR IV PUSH (FOR BLOOD PRESSURE SUPPORT)
PREFILLED_SYRINGE | INTRAVENOUS | Status: DC | PRN
Start: 2021-04-06 — End: 2021-04-06
  Administered 2021-04-06 (×3): 40 ug via INTRAVENOUS

## 2021-04-06 MED ORDER — ROCURONIUM BROMIDE 10 MG/ML (PF) SYRINGE
PREFILLED_SYRINGE | INTRAVENOUS | Status: AC
  Filled 2021-04-06: qty 10

## 2021-04-06 MED ORDER — HEMOSTATIC AGENTS (NO CHARGE) OPTIME
TOPICAL | Status: DC | PRN
Start: 2021-04-06 — End: 2021-04-06
  Administered 2021-04-06: 1 via TOPICAL

## 2021-04-06 MED ORDER — ACETAMINOPHEN 500 MG PO TABS
1000.0000 mg | ORAL_TABLET | Freq: Four times a day (QID) | ORAL | Status: DC | PRN
  Administered 2021-04-06 – 2021-04-07 (×3): 1000 mg via ORAL
  Filled 2021-04-06 (×3): qty 2

## 2021-04-06 MED ORDER — DOCUSATE SODIUM 100 MG PO CAPS
100.0000 mg | ORAL_CAPSULE | Freq: Two times a day (BID) | ORAL | Status: DC
  Administered 2021-04-06 (×2): 100 mg via ORAL
  Filled 2021-04-06 (×2): qty 1

## 2021-04-06 MED ORDER — LACTATED RINGERS IV SOLN: INTRAVENOUS | Status: DC | PRN

## 2021-04-06 MED ORDER — PROPOFOL 10 MG/ML IV BOLUS
INTRAVENOUS | Status: DC | PRN
  Administered 2021-04-06: 120 mg via INTRAVENOUS
  Administered 2021-04-06: 50 mg via INTRAVENOUS

## 2021-04-06 MED ORDER — PHENYLEPHRINE HCL-NACL 20-0.9 MG/250ML-% IV SOLN
INTRAVENOUS | Status: DC | PRN
  Administered 2021-04-06: 25 ug/min via INTRAVENOUS

## 2021-04-06 MED ORDER — DEXAMETHASONE SODIUM PHOSPHATE 10 MG/ML IJ SOLN
INTRAMUSCULAR | Status: AC
  Filled 2021-04-06: qty 1

## 2021-04-06 MED ORDER — FENTANYL CITRATE (PF) 250 MCG/5ML IJ SOLN
INTRAMUSCULAR | Status: AC
  Filled 2021-04-06: qty 5

## 2021-04-06 MED ORDER — PHENOL 1.4 % MT LIQD: 1.0000 | OROMUCOSAL | Status: DC | PRN

## 2021-04-06 MED ORDER — DEXAMETHASONE SODIUM PHOSPHATE 10 MG/ML IJ SOLN
INTRAMUSCULAR | Status: DC | PRN
  Administered 2021-04-06: 5 mg via INTRAVENOUS

## 2021-04-06 MED ORDER — OXYCODONE HCL 5 MG PO TABS: 10.0000 mg | ORAL_TABLET | ORAL | Status: DC | PRN

## 2021-04-06 MED ORDER — BUPIVACAINE LIPOSOME 1.3 % IJ SUSP
INTRAMUSCULAR | Status: DC | PRN
  Administered 2021-04-06: 20 mL

## 2021-04-06 MED ORDER — GABAPENTIN 300 MG PO CAPS
300.0000 mg | ORAL_CAPSULE | Freq: Every day | ORAL | Status: DC
  Administered 2021-04-06: 300 mg via ORAL
  Filled 2021-04-06: qty 1

## 2021-04-06 MED ORDER — MORPHINE SULFATE (PF) 2 MG/ML IV SOLN: 2.0000 mg | INTRAVENOUS | Status: DC | PRN

## 2021-04-06 MED ORDER — 0.9 % SODIUM CHLORIDE (POUR BTL) OPTIME
TOPICAL | Status: DC | PRN
  Administered 2021-04-06: 1000 mL

## 2021-04-06 MED ORDER — PROPOFOL 10 MG/ML IV BOLUS
INTRAVENOUS | Status: AC
  Filled 2021-04-06: qty 20

## 2021-04-06 MED ORDER — IRBESARTAN 150 MG PO TABS
150.0000 mg | ORAL_TABLET | Freq: Every day | ORAL | Status: DC
  Administered 2021-04-06: 150 mg via ORAL
  Filled 2021-04-06: qty 1

## 2021-04-06 MED ORDER — MICROFIBRILLAR COLL HEMOSTAT EX POWD
CUTANEOUS | Status: DC | PRN
  Administered 2021-04-06: 1 g via TOPICAL

## 2021-04-06 MED ORDER — CHLORHEXIDINE GLUCONATE CLOTH 2 % EX PADS: 6.0000 | MEDICATED_PAD | Freq: Once | CUTANEOUS | Status: DC

## 2021-04-06 MED ORDER — CHLORHEXIDINE GLUCONATE 0.12 % MT SOLN
OROMUCOSAL | Status: AC
  Administered 2021-04-06: 15 mL
  Filled 2021-04-06: qty 15

## 2021-04-06 MED ORDER — BUPIVACAINE-EPINEPHRINE (PF) 0.5% -1:200000 IJ SOLN
INTRAMUSCULAR | Status: DC | PRN
  Administered 2021-04-06: 5 mL

## 2021-04-06 MED ORDER — ACETAMINOPHEN 650 MG RE SUPP: 650.0000 mg | RECTAL | Status: DC | PRN

## 2021-04-06 MED ORDER — METOPROLOL SUCCINATE ER 25 MG PO TB24
25.0000 mg | ORAL_TABLET | Freq: Once | ORAL | Status: AC
  Administered 2021-04-06: 25 mg via ORAL

## 2021-04-06 MED ORDER — METHOCARBAMOL 500 MG PO TABS
500.0000 mg | ORAL_TABLET | Freq: Four times a day (QID) | ORAL | Status: DC | PRN
  Administered 2021-04-06: 500 mg via ORAL
  Filled 2021-04-06: qty 1

## 2021-04-06 MED ORDER — EPHEDRINE SULFATE-NACL 50-0.9 MG/10ML-% IV SOSY
PREFILLED_SYRINGE | INTRAVENOUS | Status: DC | PRN
  Administered 2021-04-06 (×2): 5 mg via INTRAVENOUS

## 2021-04-06 MED ORDER — BUPIVACAINE-EPINEPHRINE 0.5% -1:200000 IJ SOLN
INTRAMUSCULAR | Status: AC
  Filled 2021-04-06: qty 1

## 2021-04-06 MED ORDER — LIDOCAINE 2% (20 MG/ML) 5 ML SYRINGE
INTRAMUSCULAR | Status: DC | PRN
  Administered 2021-04-06: 40 mg via INTRAVENOUS

## 2021-04-06 MED ORDER — THROMBIN 5000 UNITS EX SOLR
CUTANEOUS | Status: AC
  Filled 2021-04-06: qty 15000

## 2021-04-06 MED ORDER — METHOCARBAMOL 1000 MG/10ML IJ SOLN
500.0000 mg | Freq: Four times a day (QID) | INTRAVENOUS | Status: DC | PRN
  Filled 2021-04-06: qty 5

## 2021-04-06 MED ORDER — THROMBIN 5000 UNITS EX SOLR: OROMUCOSAL | Status: DC | PRN

## 2021-04-06 MED ORDER — SODIUM CHLORIDE 0.9% FLUSH
3.0000 mL | Freq: Two times a day (BID) | INTRAVENOUS | Status: DC
  Administered 2021-04-06 (×2): 3 mL via INTRAVENOUS

## 2021-04-06 MED ORDER — SODIUM CHLORIDE 0.9% FLUSH: 3.0000 mL | INTRAVENOUS | Status: DC | PRN

## 2021-04-06 MED ORDER — MICROFIBRILLAR COLL HEMOSTAT EX POWD
CUTANEOUS | Status: AC
  Filled 2021-04-06: qty 5

## 2021-04-06 MED ORDER — ONDANSETRON HCL 4 MG/2ML IJ SOLN
INTRAMUSCULAR | Status: AC
  Filled 2021-04-06: qty 2

## 2021-04-06 MED ORDER — THROMBIN 5000 UNITS EX SOLR
CUTANEOUS | Status: DC | PRN
  Administered 2021-04-06 (×2): 5000 [IU] via TOPICAL

## 2021-04-06 MED ORDER — METHYLPREDNISOLONE ACETATE 80 MG/ML IJ SUSP
INTRAMUSCULAR | Status: AC
  Filled 2021-04-06: qty 1

## 2021-04-06 MED ORDER — PHENYLEPHRINE 40 MCG/ML (10ML) SYRINGE FOR IV PUSH (FOR BLOOD PRESSURE SUPPORT)
PREFILLED_SYRINGE | INTRAVENOUS | Status: AC
  Filled 2021-04-06: qty 10

## 2021-04-06 MED ORDER — LIDOCAINE-EPINEPHRINE 1 %-1:100000 IJ SOLN
INTRAMUSCULAR | Status: AC
  Filled 2021-04-06: qty 1

## 2021-04-06 MED ORDER — ACETAMINOPHEN 500 MG PO TABS
1000.0000 mg | ORAL_TABLET | Freq: Once | ORAL | Status: AC
  Administered 2021-04-06: 1000 mg via ORAL
  Filled 2021-04-06: qty 2

## 2021-04-06 MED ORDER — MENTHOL 3 MG MT LOZG: 1.0000 | LOZENGE | OROMUCOSAL | Status: DC | PRN

## 2021-04-06 MED ORDER — ATORVASTATIN CALCIUM 40 MG PO TABS
40.0000 mg | ORAL_TABLET | Freq: Every day | ORAL | Status: DC
  Administered 2021-04-06: 40 mg via ORAL
  Filled 2021-04-06: qty 1

## 2021-04-06 MED ORDER — LIDOCAINE-EPINEPHRINE 1 %-1:100000 IJ SOLN
INTRAMUSCULAR | Status: DC | PRN
  Administered 2021-04-06: 5 mL

## 2021-04-06 MED ORDER — BUPIVACAINE LIPOSOME 1.3 % IJ SUSP
INTRAMUSCULAR | Status: AC
  Filled 2021-04-06: qty 20

## 2021-04-06 MED ORDER — ONDANSETRON HCL 4 MG/2ML IJ SOLN
INTRAMUSCULAR | Status: DC | PRN
  Administered 2021-04-06: 4 mg via INTRAVENOUS

## 2021-04-06 MED ORDER — HYDROCODONE-ACETAMINOPHEN 5-325 MG PO TABS: 1.0000 | ORAL_TABLET | ORAL | Status: DC | PRN

## 2021-04-06 MED ORDER — LIDOCAINE 2% (20 MG/ML) 5 ML SYRINGE
INTRAMUSCULAR | Status: AC
  Filled 2021-04-06: qty 5

## 2021-04-06 MED ORDER — ONDANSETRON HCL 4 MG PO TABS: 4.0000 mg | ORAL_TABLET | Freq: Four times a day (QID) | ORAL | Status: DC | PRN

## 2021-04-06 MED ORDER — METOPROLOL SUCCINATE ER 25 MG PO TB24: 25.0000 mg | ORAL_TABLET | Freq: Every day | ORAL | Status: DC

## 2021-04-06 MED ORDER — FENTANYL CITRATE (PF) 100 MCG/2ML IJ SOLN
INTRAMUSCULAR | Status: AC
  Filled 2021-04-06: qty 2

## 2021-04-06 MED ORDER — SODIUM CHLORIDE 0.9 % IV SOLN
250.0000 mL | INTRAVENOUS | Status: DC
  Administered 2021-04-06: 250 mL via INTRAVENOUS

## 2021-04-06 MED ORDER — METOPROLOL SUCCINATE ER 25 MG PO TB24
ORAL_TABLET | ORAL | Status: AC
  Filled 2021-04-06: qty 1

## 2021-04-06 MED ORDER — CEFAZOLIN SODIUM-DEXTROSE 2-4 GM/100ML-% IV SOLN
2.0000 g | Freq: Three times a day (TID) | INTRAVENOUS | Status: AC
  Administered 2021-04-06 (×2): 2 g via INTRAVENOUS
  Filled 2021-04-06 (×2): qty 100

## 2021-04-06 MED ORDER — ONDANSETRON HCL 4 MG/2ML IJ SOLN: 4.0000 mg | Freq: Four times a day (QID) | INTRAMUSCULAR | Status: DC | PRN

## 2021-04-06 MED ORDER — SUGAMMADEX SODIUM 200 MG/2ML IV SOLN
INTRAVENOUS | Status: DC | PRN
  Administered 2021-04-06: 200 mg via INTRAVENOUS

## 2021-04-06 SURGICAL SUPPLY — 63 items
BAG COUNTER SPONGE SURGICOUNT (BAG) ×2 IMPLANT
BAND RUBBER #18 3X1/16 STRL (MISCELLANEOUS) ×4 IMPLANT
BENZOIN TINCTURE PRP APPL 2/3 (GAUZE/BANDAGES/DRESSINGS) ×1 IMPLANT
BUR CARBIDE MATCH 3.0 (BURR) ×2 IMPLANT
CARTRIDGE OIL MAESTRO DRILL (MISCELLANEOUS) ×1 IMPLANT
CNTNR URN SCR LID CUP LEK RST (MISCELLANEOUS) ×1 IMPLANT
CONT SPEC 4OZ STRL OR WHT (MISCELLANEOUS)
COVER MAYO STAND STRL (DRAPES) ×2 IMPLANT
DECANTER SPIKE VIAL GLASS SM (MISCELLANEOUS) ×1 IMPLANT
DERMABOND ADVANCED (GAUZE/BANDAGES/DRESSINGS) ×1
DERMABOND ADVANCED .7 DNX12 (GAUZE/BANDAGES/DRESSINGS) IMPLANT
DIFFUSER DRILL AIR PNEUMATIC (MISCELLANEOUS) ×1 IMPLANT
DRAIN JACKSON RD 7FR 3/32 (WOUND CARE) IMPLANT
DRAPE C-ARM 42X72 X-RAY (DRAPES) ×2 IMPLANT
DRAPE LAPAROTOMY 100X72X124 (DRAPES) ×2 IMPLANT
DRAPE MICROSCOPE LEICA (MISCELLANEOUS) ×2 IMPLANT
DRAPE SURG 17X23 STRL (DRAPES) ×1 IMPLANT
DRSG OPSITE POSTOP 4X6 (GAUZE/BANDAGES/DRESSINGS) ×1 IMPLANT
DURAPREP 26ML APPLICATOR (WOUND CARE) ×2 IMPLANT
ELECT BLADE INSULATED 4IN (ELECTROSURGICAL) ×2
ELECT REM PT RETURN 9FT ADLT (ELECTROSURGICAL) ×2
ELECTRODE BLADE INSULATED 4IN (ELECTROSURGICAL) ×1 IMPLANT
ELECTRODE REM PT RTRN 9FT ADLT (ELECTROSURGICAL) ×1 IMPLANT
EVACUATOR 1/8 PVC DRAIN (DRAIN) IMPLANT
GAUZE 4X4 16PLY ~~LOC~~+RFID DBL (SPONGE) ×1 IMPLANT
GAUZE SPONGE 4X4 12PLY STRL (GAUZE/BANDAGES/DRESSINGS) ×1 IMPLANT
GLOVE SRG 8 PF TXTR STRL LF DI (GLOVE) ×1 IMPLANT
GLOVE SURG LTX SZ8 (GLOVE) ×2 IMPLANT
GLOVE SURG UNDER POLY LF SZ8 (GLOVE) ×2
GOWN STRL REUS W/ TWL LRG LVL3 (GOWN DISPOSABLE) IMPLANT
GOWN STRL REUS W/ TWL XL LVL3 (GOWN DISPOSABLE) ×1 IMPLANT
GOWN STRL REUS W/TWL 2XL LVL3 (GOWN DISPOSABLE) IMPLANT
GOWN STRL REUS W/TWL LRG LVL3 (GOWN DISPOSABLE) ×4
GOWN STRL REUS W/TWL XL LVL3 (GOWN DISPOSABLE) ×2
HEMOSTAT POWDER KIT SURGIFOAM (HEMOSTASIS) ×1 IMPLANT
KIT BASIN OR (CUSTOM PROCEDURE TRAY) ×2 IMPLANT
KIT POSITION SURG JACKSON T1 (MISCELLANEOUS) ×2 IMPLANT
KIT TURNOVER KIT B (KITS) ×2 IMPLANT
MARKER SKIN DUAL TIP RULER LAB (MISCELLANEOUS) ×2 IMPLANT
NDL HYPO 25X1 1.5 SAFETY (NEEDLE) ×1 IMPLANT
NDL SPNL 20GX3.5 QUINCKE YW (NEEDLE) IMPLANT
NDL SPNL 22GX3.5 QUINCKE BK (NEEDLE) IMPLANT
NEEDLE HYPO 25X1 1.5 SAFETY (NEEDLE) ×2 IMPLANT
NEEDLE SPNL 20GX3.5 QUINCKE YW (NEEDLE) ×2 IMPLANT
NEEDLE SPNL 22GX3.5 QUINCKE BK (NEEDLE) ×2 IMPLANT
NS IRRIG 1000ML POUR BTL (IV SOLUTION) ×2 IMPLANT
OIL CARTRIDGE MAESTRO DRILL (MISCELLANEOUS) ×2
PACK LAMINECTOMY NEURO (CUSTOM PROCEDURE TRAY) ×2 IMPLANT
PAD ARMBOARD 7.5X6 YLW CONV (MISCELLANEOUS) ×6 IMPLANT
PATTIES SURGICAL .5 X.5 (GAUZE/BANDAGES/DRESSINGS) IMPLANT
PATTIES SURGICAL .5 X1 (DISPOSABLE) IMPLANT
PATTIES SURGICAL 1X1 (DISPOSABLE) IMPLANT
SPONGE SURGIFOAM ABS GEL 100 (HEMOSTASIS) ×1 IMPLANT
SPONGE SURGIFOAM ABS GEL 12-7 (HEMOSTASIS) ×1 IMPLANT
SPONGE T-LAP 4X18 ~~LOC~~+RFID (SPONGE) ×1 IMPLANT
STRIP CLOSURE SKIN 1/2X4 (GAUZE/BANDAGES/DRESSINGS) ×1 IMPLANT
SUT VIC AB 0 CT1 27 (SUTURE) ×2
SUT VIC AB 0 CT1 27XBRD ANBCTR (SUTURE) ×1 IMPLANT
SUT VIC AB 2-0 CP2 18 (SUTURE) ×3 IMPLANT
SUT VIC AB 3-0 SH 8-18 (SUTURE) ×1 IMPLANT
TOWEL GREEN STERILE (TOWEL DISPOSABLE) ×1 IMPLANT
TOWEL GREEN STERILE FF (TOWEL DISPOSABLE) ×1 IMPLANT
WATER STERILE IRR 1000ML POUR (IV SOLUTION) ×2 IMPLANT

## 2021-04-06 NOTE — Op Note (Signed)
Providing Compassionate, Quality Care - Together   Date of service: 04/06/2021   PREOP DIAGNOSIS:  L4-5, L5-S1 lumbar stenosis with neurogenic claudication   POSTOP DIAGNOSIS: Same   PROCEDURE: Open bilateral L4, L5, S1 laminectomy for decompression neural elements Intraoperative use of microscope for microdissection Intraoperative use of fluoroscopy   SURGEON: Dr. Kendell Bane C. Tamico Mundo, DO   ASSISTANT: Dr. Hoyt Koch, MD   ANESTHESIA: General Endotracheal   EBL: 50 cc   SPECIMENS: None   DRAINS: None   COMPLICATIONS: None   CONDITION: Hemodynamically stable   HISTORY: Henry Patrick is a 81 y.o. male who presented with complaints of bilateral lower extremity numbness, tingling and pain when ambulating short distances as well as standing short times.  He had significant relief whenever he sat down.  MRI revealed severe stenosis at L4-5 due to lumbar spondylosis, broad-based disc bulging and ligamentum hypertrophy, and moderate to severe stenosis at L5-S1.  He failed conservative measures therefore I offered surgical decompression in the form of an L4-5 and L5-S1 open laminectomy for decompression.  We discussed all risks, benefits and expected outcomes as well as alternatives to treatment.  Informed consent was obtained.   PROCEDURE IN DETAIL: The patient was brought to the operating room. After induction of general anesthesia, the patient was positioned on the operative table in the prone position. All pressure points were meticulously padded. Skin incision was then marked out and prepped and draped in the usual sterile fashion.   Using a 10 blade, midline incision was created over the L4-S1 spinous processes.  Using Bovie electrocautery soft tissue dissection was performed down to the lumbodorsal fascia.  Subperiosteal dissection was performed bilaterally with Bovie electrocautery exposing the L4, L5, S1 lamina bilaterally.  Deep retractors placed in the wound.  Lateral  fluoroscopy confirmed the appropriate level.  The microscope was sterilely draped and brought into the field.  Using Leksell rongeur, the spinous process of L4, L5, S1 were removed down to the lamina.  Using a high-speed drill, the L4 lamina was removed bilaterally to the lateral recess down to the ligamentum flavum and superiorly up to the ligamentous attachment bilaterally.  Using high-speed drill, a partial bilateral facetectomy was performed down to the ligamentum flavum.  The superior portion of L5 bilateral lamina was removed with high-speed drill down to the epidural space.  The ligamentum flavum was then gently dissected from the epidural space and resected with a series of Kerrison rongeurs to the lateral recess bilaterally.  Using a ball-tipped probe, bilateral lateral recesses appeared decompressed.  The bilateral lateral recess were explored again with a ball-tipped probe and noted to be appropriately decompressed.  The thecal sac was pulsatile.  Bilateral foramen were also palpated with a ball-tipped probe and noted to be adequately decompressed.    Using a high-speed drill, the L5 lamina was removed bilaterally to the lateral recess down to the ligamentum flavum and superiorly up to the ligamentous attachment bilaterally.  Using high-speed drill, a partial bilateral facetectomy was performed down to the ligamentum flavum.  The superior portion of S1 bilateral lamina was removed with high-speed drill down to the epidural space.  The ligamentum flavum was then gently dissected from the epidural space and resected with a series of Kerrison rongeurs to the lateral recess bilaterally.  Using a ball-tipped probe, bilateral lateral recesses appeared decompressed.  The bilateral lateral recess were explored again with a ball-tipped probe and noted to be appropriately decompressed.  The thecal sac was pulsatile.  Bilateral foramen were also palpated with a ball-tipped probe and noted to be adequately  decompressed.    A mixture of Depo-Medrol and fentanyl was placed in the epidural space with Avitene. Deep retractor was taken out of the wound.  Hemostasis was achieved with bipolar cautery and the soft tissues.  The wound was closed in layers with 0 Vicryl sutures for muscle and fascia.  Dermis was closed with 2-0 and 3-0 Vicryl sutures.  Skin was closed with skin glue.  Sterile dressing was applied.   At the end of the case all sponge, needle, and instrument counts were correct. The patient was then transferred to the stretcher, extubated, and taken to the post-anesthesia care unit in stable hemodynamic condition.

## 2021-04-06 NOTE — Transfer of Care (Signed)
Immediate Anesthesia Transfer of Care Note  Patient: Henry Patrick  Procedure(s) Performed: OPEN LAMINECTOMY, LUMBAR FOUR-FIVE, LUMBAR FIVE-SACRAL ONE (Back)  Patient Location: PACU  Anesthesia Type:General  Level of Consciousness: drowsy  Airway & Oxygen Therapy: Patient Spontanous Breathing and Patient connected to nasal cannula oxygen  Post-op Assessment: Report given to RN and Post -op Vital signs reviewed and stable  Post vital signs: Reviewed and stable  Last Vitals:  Vitals Value Taken Time  BP 122/54 04/06/21 0949  Temp    Pulse 77 04/06/21 0951  Resp 18 04/06/21 0951  SpO2 94 % 04/06/21 0951  Vitals shown include unvalidated device data.  Last Pain:  Vitals:   04/06/21 0629  TempSrc:   PainSc: 3       Patients Stated Pain Goal: 2 (04/06/21 3810)  Complications: No notable events documented.

## 2021-04-06 NOTE — Anesthesia Procedure Notes (Signed)
Procedure Name: Intubation Date/Time: 04/06/2021 7:45 AM Performed by: Colin Benton, CRNA Pre-anesthesia Checklist: Patient identified, Emergency Drugs available, Suction available and Patient being monitored Patient Re-evaluated:Patient Re-evaluated prior to induction Oxygen Delivery Method: Circle system utilized Preoxygenation: Pre-oxygenation with 100% oxygen Induction Type: IV induction Ventilation: Mask ventilation without difficulty Laryngoscope Size: Miller and 2 Grade View: Grade II Tube type: Oral Tube size: 7.5 mm Number of attempts: 1 Airway Equipment and Method: Stylet Placement Confirmation: ETT inserted through vocal cords under direct vision, positive ETCO2 and breath sounds checked- equal and bilateral Secured at: 23 cm Tube secured with: Tape Dental Injury: Teeth and Oropharynx as per pre-operative assessment

## 2021-04-06 NOTE — H&P (Signed)
Providing Compassionate, Quality Care - Together  NEUROSURGERY HISTORY & PHYSICAL   Henry Patrick is an 81 y.o. male.   Chief Complaint: Bilateral lower extremity numbness HPI: This is an 81 year old male with complaints of bilateral lower extremity numbness, difficulty standing or walking for short distances.  He also has tingling in similar distribution with fatigue in his legs that significantly improves when he sits down.  MRI revealed severe stenosis at L4-5 and L5-S1 due to spondylosis and ligamentum hypertrophy.  He presents today for open laminectomy and decompression.  He denies any changes in symptomatology.  Past Medical History:  Diagnosis Date   Coronary artery disease    Atherosclerotic CAD   Dyslipidemia    History of dyslipidemia   History of arthritis    Hyperlipidemia    Obesity    Plantar fasciitis    Spinal stenosis     Past Surgical History:  Procedure Laterality Date   CARDIAC CATHETERIZATION  08/02/2004   ejection fraction estimated 60%   CORONARY ARTERY BYPASS GRAFT  1999   x2 by Dr. Armida [email or web address]   TONSILLECTOMY      Family History  Problem Relation Age of Onset   Heart attack Mother    Heart attack Father 58   Hypertension Brother    Cirrhosis Sister    Social History:  reports that he has never smoked. He has never used smokeless tobacco. He reports that he does not drink alcohol and does not use drugs.  Allergies: No Known Allergies  Medications Prior to Admission  Medication Sig Dispense Refill   acetaminophen (TYLENOL) 500 MG tablet Take 1,000 mg by mouth every 6 (six) hours as needed for mild pain.     aspirin EC 81 MG tablet Take 1 tablet (81 mg total) by mouth daily. 90 tablet 3   atorvastatin (LIPITOR) 40 MG tablet Take 1 tablet (40 mg total) by mouth daily. 90 tablet 3   Bacillus Coagulans-Inulin (ALIGN PREBIOTIC-PROBIOTIC PO) Take 1 tablet by mouth daily.     Fish Oil-Cholecalciferol (FISH OIL + D3 PO) Take 1,000 mg  by mouth daily.     gabapentin (NEURONTIN) 300 MG capsule Take 300 mg by mouth at bedtime.     Lysine 500 MG TABS Take 500 mg by mouth daily.     metoprolol succinate (TOPROL-XL) 25 MG 24 hr tablet Take 25 mg by mouth daily. for blood pressure  0   Multiple Vitamin (MULTI-VITAMIN PO) Take 1 tablet by mouth daily.      olmesartan (BENICAR) 20 MG tablet Take 20 mg by mouth daily.     COVID-19 mRNA bivalent vaccine, Moderna, (MODERNA COVID-19 BIVAL BOOSTER) 50 MCG/0.5ML injection Inject into the muscle. 0.5 mL 0   naproxen sodium (ALEVE) 220 MG tablet Take 220 mg by mouth daily as needed (pain).      No results found for this or any previous visit (from the past 48 hour(s)). No results found.  ROS All positives and negatives are listed in HPI above  Blood pressure 140/82, pulse 84, temperature 98.7 F (37.1 C), temperature source Oral, resp. rate 18, height 5\' 9"  (1.753 m), weight 88.5 kg, SpO2 96 %. Physical Exam  Awake alert oriented x3, no acute distress PERRLA EOMI Cranial nerves II through XII intact Bilateral upper extremity full strength and symmetric Bilateral lower extremity 4+/5 throughout Sensory intact to light touch throughout  Assessment/Plan 81 year old male with  L4-S1 lumbar stenosis with neurogenic claudication  -OR today for L4-S1 open laminectomy.  We discussed all risks, benefits and expected outcomes as well as alternatives to treatment.  Informed consent was obtained.  I answered all of his questions.  Thank you for allowing me to participate in this patient's care.  Please do not hesitate to call with questions or concerns.   Monia Pouch, DO Neurosurgeon [facility] Cell: [phone]

## 2021-04-06 NOTE — Anesthesia Postprocedure Evaluation (Signed)
Anesthesia Post Note  Patient: Sage A Shippee  Procedure(s) Performed: OPEN LAMINECTOMY, LUMBAR FOUR-FIVE, LUMBAR FIVE-SACRAL ONE (Back)     Patient location during evaluation: Nursing Unit Anesthesia Type: General Level of consciousness: awake and alert, patient cooperative and oriented Pain management: pain level controlled Vital Signs Assessment: post-procedure vital signs reviewed and stable Respiratory status: spontaneous breathing, nonlabored ventilation and respiratory function stable Cardiovascular status: blood pressure returned to baseline and stable Postop Assessment: no apparent nausea or vomiting, adequate PO intake and able to ambulate Anesthetic complications: no   No notable events documented.  Last Vitals:  Vitals:   04/06/21 1052 04/06/21 1616  BP: (!) 141/61 129/61  Pulse: (!) 59 63  Resp: 20 18  Temp:  36.7 C  SpO2: 96% 96%    Last Pain:  Vitals:   04/06/21 1616  TempSrc: Oral  PainSc:                  Ermel Verne,E. Nuel Dejaynes

## 2021-04-07 ENCOUNTER — Encounter (HOSPITAL_COMMUNITY): Payer: Self-pay | Admitting: Neurological Surgery

## 2021-04-07 DIAGNOSIS — M48062 Spinal stenosis, lumbar region with neurogenic claudication: Secondary | ICD-10-CM | POA: Diagnosis not present

## 2021-04-07 MED ORDER — METHOCARBAMOL 500 MG PO TABS
500.0000 mg | ORAL_TABLET | Freq: Four times a day (QID) | ORAL | 1 refills | Status: AC
Start: 2021-04-07 — End: ?

## 2021-04-07 MED ORDER — HYDROCODONE-ACETAMINOPHEN 5-325 MG PO TABS: 1.0000 | ORAL_TABLET | ORAL | 0 refills | Status: AC | PRN

## 2021-04-07 NOTE — Discharge Instructions (Signed)
Wound Care Leave incision open to air. You may shower. Do not scrub directly on incision.  Do not put any creams, lotions, or ointments on incision. Activity Walk each and every day, increasing distance each day. No lifting greater than 5 lbs.  Avoid bending, arching, and twisting. No driving for 2 weeks; may ride as a passenger locally.  Diet Resume your normal diet.   Call Your Doctor If Any of These Occur Redness, drainage, or swelling at the wound.  Temperature greater than 101 degrees. Severe pain not relieved by pain medication. Incision starts to come apart. Follow Up Appt Call  2-3 weeks [phone]) or for problems.

## 2021-04-07 NOTE — Progress Notes (Signed)
Occupational Therapy Evaluation Patient Details Name: Henry Patrick MRN: [id number] DOB: 11-07-1940 Today's Date: 04/07/2021   History of Present Illness 81 yo M adm for laminectomy L4/5 and L5/S1.  PMH includes: Coronary artery disease, Dyslipidemia, Hyperlipidemia, Spinal stenosis.   Clinical Impression   Patient admitted for the diagnosis and procedure above.  PTA he lives with his spouse, and needed no assist with any aspect of ADL/IADL.  He is at or near his baseline for ADL completion from sit/stand level.  He needed no assist for in room mobility.  No acute OT needs, and all questions answered.        Recommendations for follow up therapy are one component of a multi-disciplinary discharge planning process, led by the attending physician.  Recommendations may be updated based on patient status, additional functional criteria and insurance authorization.   Follow Up Recommendations  No OT follow up    Assistance Recommended at Discharge PRN  Patient can return home with the following      Functional Status Assessment  Patient has not had a recent decline in their functional status  Equipment Recommendations  None recommended by OT    Recommendations for Other Services       Precautions / Restrictions Precautions Precautions: Back Precaution Booklet Issued: Yes (comment) Restrictions Weight Bearing Restrictions: No      Mobility Bed Mobility Overal bed mobility: Modified Independent                  Transfers Overall transfer level: Modified independent                        Balance Overall balance assessment: Mild deficits observed, not formally tested                                         ADL either performed or assessed with clinical judgement   ADL Overall ADL's : At baseline                                             Vision Patient Visual Report: No change from baseline       Perception  Perception Perception: Not tested   Praxis Praxis Praxis: Not tested    Pertinent Vitals/Pain Pain Assessment Pain Assessment: Faces Faces Pain Scale: Hurts a little bit Pain Location: Incisional site Pain Descriptors / Indicators: Tender Pain Intervention(s): Monitored during session     Hand Dominance Right   Extremity/Trunk Assessment Upper Extremity Assessment Upper Extremity Assessment: Overall WFL for tasks assessed   Lower Extremity Assessment Lower Extremity Assessment: Defer to PT evaluation   Cervical / Trunk Assessment Cervical / Trunk Assessment: Back Surgery   Communication Communication Communication: No difficulties   Cognition Arousal/Alertness: Awake/alert Behavior During Therapy: WFL for tasks assessed/performed Overall Cognitive Status: Within Functional Limits for tasks assessed                                       General Comments       Exercises     Shoulder Instructions      Home Living Family/patient expects to be discharged to:: Private residence Living Arrangements: Spouse/significant other  Available Help at Discharge: Family;Available 24 hours/day Type of Home: House Home Access: Stairs to enter [employer] of Steps: 3 Entrance Stairs-Rails: Left Home Layout: One level     Bathroom Shower/Tub: Tub/shower unit;Walk-in shower   Bathroom Toilet: Standard Bathroom Accessibility: Yes How Accessible: Accessible via walker Home Equipment: None   Additional Comments: Going to buy a shower seat      Prior Functioning/Environment Prior Level of Function : Independent/Modified Independent                        OT Problem List: Pain      OT Treatment/Interventions:      OT Goals(Current goals can be found in the care plan section) Acute Rehab OT Goals Patient Stated Goal: Be able to do yard work OT Goal Formulation: With patient Time For Goal Achievement: 04/08/21 Potential to Achieve  Goals: Good  OT Frequency:      Co-evaluation              AM-PAC OT "6 Clicks" Daily Activity     Outcome Measure Help from another person eating meals?: None Help from another person taking care of personal grooming?: None Help from another person toileting, which includes using toliet, bedpan, or urinal?: None Help from another person bathing (including washing, rinsing, drying)?: None Help from another person to put on and taking off regular upper body clothing?: None Help from another person to put on and taking off regular lower body clothing?: None 6 Click Score: 24   End of Session Nurse Communication: Mobility status  Activity Tolerance: Patient tolerated treatment well Patient left: in bed;with call bell/phone within reach  OT Visit Diagnosis: Pain                Time: 4332-9518 OT Time Calculation (min): 29 min Charges:  OT General Charges $OT Visit: 1 Visit OT Evaluation $OT Eval Moderate Complexity: 1 Mod OT Treatments $Self Care/Home Management : 8-22 mins  04/07/2021  RP, OTR/L  Acute Rehabilitation Services  Office:  [phone]   Suzanna Obey 04/07/2021, 8:50 AM

## 2021-04-07 NOTE — Plan of Care (Signed)
Patient alert and oriented, mae's well, voiding adequate amount of urine, swallowing without difficulty, no c/o pain at time of discharge. Patient discharged home with family. Script and discharged instructions given to patient. Patient and family stated understanding of instructions given. Patient has an appointment with Dr. Dawley 

## 2021-04-07 NOTE — Evaluation (Signed)
Physical Therapy Evaluation Patient Details Name: Henry Patrick MRN: [id number] DOB: 1940-05-17 Today's Date: 04/07/2021  History of Present Illness  Pt is an 81 y/o M who presents s/p laminectomy L4-S1 on 04/06/2021.  PMH significant for CAD, CABG 1999.  Clinical Impression  Pt admitted with above diagnosis. At the time of PT eval, pt was able to demonstrate transfers and ambulation with up to min guard assist and no AD. Pt was educated on precautions, positioning recommendations, appropriate activity progression, and car transfer. Pt currently with functional limitations due to the deficits listed below (see PT Problem List). Pt will benefit from skilled PT to increase their independence and safety with mobility to allow discharge to the venue listed below.         Recommendations for follow up therapy are one component of a multi-disciplinary discharge planning process, led by the attending physician.  Recommendations may be updated based on patient status, additional functional criteria and insurance authorization.  Follow Up Recommendations No PT follow up    Assistance Recommended at Discharge PRN  Patient can return home with the following  A little help with walking and/or transfers;Assist for transportation;Help with stairs or ramp for entrance    Equipment Recommendations BSC/3in1  Recommendations for Other Services       Functional Status Assessment Patient has had a recent decline in their functional status and demonstrates the ability to make significant improvements in function in a reasonable and predictable amount of time.     Precautions / Restrictions Precautions Precautions: Back Precaution Booklet Issued: Yes (comment) Precaution Comments: Reviewed handout and pt was cued for precautions during functional mobility. Restrictions Weight Bearing Restrictions: No      Mobility  Bed Mobility Overal bed mobility: Modified Independent             General bed  mobility comments: HOB flat and rails lowered to simulate home environment.    Transfers Overall transfer level: Modified independent Equipment used: None               General transfer comment: Pt demonstrated proper hand placement on seated surface for safety. No unsteadiness when powering up to full stand.    Ambulation/Gait Ambulation/Gait assistance: Supervision, Min guard Gait Distance (Feet): 350 Feet Assistive device: None Gait Pattern/deviations: Step-through pattern, Decreased stride length, Trunk flexed Gait velocity: Decreased Gait velocity interpretation: 1.31 - 2.62 ft/sec, indicative of limited community ambulator   General Gait Details: VC's for improved posture. Occasional unsteadiness noted however pt able to recover without assist.  Stairs Stairs:  (Pt declined stair training, states he feels comfortable being able to negotiate his stairs.)          Wheelchair Mobility    Modified Rankin (Stroke Patients Only)       Balance Overall balance assessment: Mild deficits observed, not formally tested                                           Pertinent Vitals/Pain Pain Assessment Pain Assessment: 0-10 Pain Score: 2  Pain Location: Incisional site Pain Descriptors / Indicators: Tender, Operative site guarding Pain Intervention(s): Limited activity within patient's tolerance, Monitored during session, Repositioned    Home Living Family/patient expects to be discharged to:: Private residence Living Arrangements: Spouse/significant other Available Help at Discharge: Family;Available 24 hours/day Type of Home: House Home Access: Stairs to enter Entrance Stairs-Rails: Left Entrance  Stairs-Number of Steps: 3   Home Layout: One level Home Equipment: None      Prior Function Prior Level of Function : Independent/Modified Independent                     Hand Dominance   Dominant Hand: Right    Extremity/Trunk  Assessment   Upper Extremity Assessment Upper Extremity Assessment: Defer to OT evaluation    Lower Extremity Assessment Lower Extremity Assessment: Generalized weakness    Cervical / Trunk Assessment Cervical / Trunk Assessment: Back Surgery  Communication   Communication: No difficulties  Cognition Arousal/Alertness: Awake/alert Behavior During Therapy: WFL for tasks assessed/performed Overall Cognitive Status: Within Functional Limits for tasks assessed                                          General Comments      Exercises     Assessment/Plan    PT Assessment Patient needs continued PT services  PT Problem List Decreased strength;Decreased activity tolerance;Decreased balance;Decreased mobility;Decreased knowledge of use of DME;Decreased knowledge of precautions;Decreased safety awareness;Pain       PT Treatment Interventions DME instruction;Gait training;Stair training;Functional mobility training;Therapeutic activities;Therapeutic exercise;Neuromuscular re-education;Patient/family education    PT Goals (Current goals can be found in the Care Plan section)  Acute Rehab PT Goals Patient Stated Goal: Home today PT Goal Formulation: With patient Time For Goal Achievement: 04/14/21 Potential to Achieve Goals: Good    Frequency Min 5X/week     Co-evaluation               AM-PAC PT "6 Clicks" Mobility  Outcome Measure Help needed turning from your back to your side while in a flat bed without using bedrails?: None Help needed moving from lying on your back to sitting on the side of a flat bed without using bedrails?: None Help needed moving to and from a bed to a chair (including a wheelchair)?: None Help needed standing up from a chair using your arms (e.g., wheelchair or bedside chair)?: None Help needed to walk in hospital room?: A Little Help needed climbing 3-5 steps with a railing? : A Little 6 Click Score: 22    End of Session  Equipment Utilized During Treatment: Gait belt Activity Tolerance: Patient tolerated treatment well Patient left: in bed;with call bell/phone within reach Nurse Communication: Mobility status PT Visit Diagnosis: Unsteadiness on feet (R26.81);Pain Pain - part of body:  (back)    Time: 4431-5400 PT Time Calculation (min) (ACUTE ONLY): 11 min   Charges:   PT Evaluation $PT Eval Low Complexity: 1 Low          Conni Slipper, PT, DPT Acute Rehabilitation Services Pager: [phone] Office: [phone]   Marylynn Pearson 04/07/2021, 1:37 PM

## 2021-04-07 NOTE — Discharge Summary (Signed)
Physician Discharge Summary  Patient ID: Henry Patrick MRN: [id number] DOB/AGE: 83-Jan-1942 83 y.o.  Admit date: 04/06/2021 Discharge date: 04/07/2021  Admission Diagnoses: L4-5, L5-S1 lumbar stenosis with neurogenic claudication   Discharge Diagnoses: L4-5, L5-S1 lumbar stenosis with neurogenic claudication Principal Problem:   Lumbar stenosis with neurogenic claudication   Discharged Condition: good  Hospital Course: The patient was admitted on 04/06/2021 and taken to the operating room where the patient underwent open laminectomy L4/5 and L5/S1. The patient tolerated the procedure well and was taken to the recovery room and then to the floor in stable condition. The hospital course was routine. There were no complications. The wound remained clean dry and intact. Pt had appropriate back soreness. No complaints of leg pain or new N/T/W. The patient remained afebrile with stable vital signs, and tolerated a regular diet. The patient continued to increase activities, and pain was well controlled with oral pain medications.   Consults: None  Significant Diagnostic Studies: radiology: X-Ray: intraoperative   Treatments: surgery:  Open bilateral L4, L5, S1 laminectomy for decompression neural elements Intraoperative use of microscope for microdissection Intraoperative use of fluoroscopy  Discharge Exam: Blood pressure 105/62, pulse 63, temperature 98 F (36.7 C), temperature source Oral, resp. rate 20, height 5\' 9"  (1.753 m), weight 88.5 kg, SpO2 95 %. Physical Exam: Patient is awake, A/O X 4, conversant, and in good spirits. They are in NAD and VSS. Doing well. Speech is fluent and appropriate. MAEW with good strength that is symmetric bilaterally. 5/5 BUE/BLE. Sensation to light touch is intact. PERLA, EOMI. CNs grossly intact. Dressing is clean dry intact. Incision is well approximated with no drainage, erythema, or edema.        Disposition: Discharge disposition: 01-Home or  Self Care       Discharge Instructions     Incentive spirometry RT   Complete by: As directed       Allergies as of 04/07/2021   No Known Allergies      Medication List     STOP taking these medications    gabapentin 300 MG capsule Commonly known as: NEURONTIN       TAKE these medications    acetaminophen 500 MG tablet Commonly known as: TYLENOL Take 1,000 mg by mouth every 6 (six) hours as needed for mild pain.   ALIGN PREBIOTIC-PROBIOTIC PO Take 1 tablet by mouth daily.   aspirin EC 81 MG tablet Take 1 tablet (81 mg total) by mouth daily.   atorvastatin 40 MG tablet Commonly known as: LIPITOR Take 1 tablet (40 mg total) by mouth daily.   FISH OIL + D3 PO Take 1,000 mg by mouth daily.   HYDROcodone-acetaminophen 5-325 MG tablet Commonly known as: NORCO/VICODIN Take 1-2 tablets by mouth every 4 (four) hours as needed for moderate pain.   Lysine 500 MG Tabs Take 500 mg by mouth daily.   methocarbamol 500 MG tablet Commonly known as: Robaxin Take 1 tablet (500 mg total) by mouth 4 (four) times daily.   metoprolol succinate 25 MG 24 hr tablet Commonly known as: TOPROL-XL Take 25 mg by mouth daily. for blood pressure   Moderna COVID-19 Bival Booster 50 MCG/0.5ML injection Generic drug: COVID-19 mRNA bivalent vaccine (Moderna) Inject into the muscle.   MULTI-VITAMIN PO Take 1 tablet by mouth daily.   naproxen sodium 220 MG tablet Commonly known as: ALEVE Take 220 mg by mouth daily as needed (pain).   olmesartan 20 MG tablet Commonly known as: BENICAR Take 20 mg  by mouth daily.         Signed: Marvis Moeller, DNP, NP-C 04/07/2021, 8:28 AM

## 2021-06-24 DIAGNOSIS — E785 Hyperlipidemia, unspecified: Secondary | ICD-10-CM | POA: Diagnosis not present

## 2021-06-24 DIAGNOSIS — Z125 Encounter for screening for malignant neoplasm of prostate: Secondary | ICD-10-CM | POA: Diagnosis not present

## 2021-06-24 DIAGNOSIS — I119 Hypertensive heart disease without heart failure: Secondary | ICD-10-CM | POA: Diagnosis not present

## 2021-06-24 DIAGNOSIS — I10 Essential (primary) hypertension: Secondary | ICD-10-CM | POA: Diagnosis not present

## 2021-07-01 DIAGNOSIS — R82998 Other abnormal findings in urine: Secondary | ICD-10-CM | POA: Diagnosis not present

## 2021-07-01 DIAGNOSIS — R1312 Dysphagia, oropharyngeal phase: Secondary | ICD-10-CM | POA: Diagnosis not present

## 2021-07-01 DIAGNOSIS — E785 Hyperlipidemia, unspecified: Secondary | ICD-10-CM | POA: Diagnosis not present

## 2021-07-01 DIAGNOSIS — I2510 Atherosclerotic heart disease of native coronary artery without angina pectoris: Secondary | ICD-10-CM | POA: Diagnosis not present

## 2021-07-01 DIAGNOSIS — I119 Hypertensive heart disease without heart failure: Secondary | ICD-10-CM | POA: Diagnosis not present

## 2021-07-01 DIAGNOSIS — Z1331 Encounter for screening for depression: Secondary | ICD-10-CM | POA: Diagnosis not present

## 2021-07-01 DIAGNOSIS — Z1339 Encounter for screening examination for other mental health and behavioral disorders: Secondary | ICD-10-CM | POA: Diagnosis not present

## 2021-07-01 DIAGNOSIS — M48062 Spinal stenosis, lumbar region with neurogenic claudication: Secondary | ICD-10-CM | POA: Diagnosis not present

## 2021-07-01 DIAGNOSIS — I872 Venous insufficiency (chronic) (peripheral): Secondary | ICD-10-CM | POA: Diagnosis not present

## 2021-07-01 DIAGNOSIS — Z23 Encounter for immunization: Secondary | ICD-10-CM | POA: Diagnosis not present

## 2021-07-01 DIAGNOSIS — Z0000 Encounter for general adult medical examination without abnormal findings: Secondary | ICD-10-CM | POA: Diagnosis not present

## 2021-07-23 NOTE — Progress Notes (Unsigned)
Henry Patrick Date of Birth: 1940/09/02 Medical Record #[id number]  History of Present Illness: Henry Patrick is seen today for follow up CAD. He has a history of coronary disease and is status post CABG in 1999. This included an LIMA graft to the LAD and a saphenous vein graft to the obtuse marginal vessel. Cath in 2006 showed grafts were patent. Myoview study in April 2012 was normal. Repeat Myoview in July 2017 was felt to be unchanged. He also has a history of dyslipidemia and obesity.    He has continued problems with his spinal stenosis. His major complaint is of leg and back pain. He does have spinal stenosis and neuropathy.  Now seeing Dr Jake Samples. He did have L4-S1 laminectomy in February 2023.    His activity has been limited but he does note he gets chest pain and SOB when he works in the yard that goes away when he rests. Hasn't used Ntg.   Current Outpatient Medications on File Prior to Visit  Medication Sig Dispense Refill   acetaminophen (TYLENOL) 500 MG tablet Take 1,000 mg by mouth every 6 (six) hours as needed for mild pain.     aspirin EC 81 MG tablet Take 1 tablet (81 mg total) by mouth daily. 90 tablet 3   atorvastatin (LIPITOR) 40 MG tablet Take 1 tablet (40 mg total) by mouth daily. 90 tablet 3   Bacillus Coagulans-Inulin (ALIGN PREBIOTIC-PROBIOTIC PO) Take 1 tablet by mouth daily.     COVID-19 mRNA bivalent vaccine, Moderna, (MODERNA COVID-19 BIVAL BOOSTER) 50 MCG/0.5ML injection Inject into the muscle. 0.5 mL 0   Fish Oil-Cholecalciferol (FISH OIL + D3 PO) Take 1,000 mg by mouth daily.     HYDROcodone-acetaminophen (NORCO/VICODIN) 5-325 MG tablet Take 1-2 tablets by mouth every 4 (four) hours as needed for moderate pain. 20 tablet 0   Lysine 500 MG TABS Take 500 mg by mouth daily.     methocarbamol (ROBAXIN) 500 MG tablet Take 1 tablet (500 mg total) by mouth 4 (four) times daily. 90 tablet 1   metoprolol succinate (TOPROL-XL) 25 MG 24 hr tablet Take 25 mg by mouth daily.  for blood pressure  0   Multiple Vitamin (MULTI-VITAMIN PO) Take 1 tablet by mouth daily.      naproxen sodium (ALEVE) 220 MG tablet Take 220 mg by mouth daily as needed (pain).     olmesartan (BENICAR) 20 MG tablet Take 20 mg by mouth daily.     No current facility-administered medications on file prior to visit.    No Known Allergies  Past Medical History:  Diagnosis Date   Coronary artery disease    Atherosclerotic CAD   Dyslipidemia    History of dyslipidemia   History of arthritis    Hyperlipidemia    Obesity    Plantar fasciitis    Spinal stenosis     Past Surgical History:  Procedure Laterality Date   CARDIAC CATHETERIZATION  08/02/2004   ejection fraction estimated 60%   CORONARY ARTERY BYPASS GRAFT  1999   x2 by Dr. Armida [email or web address]   LUMBAR LAMINECTOMY/DECOMPRESSION MICRODISCECTOMY N/A 04/06/2021   Procedure: OPEN LAMINECTOMY, LUMBAR FOUR-FIVE, LUMBAR FIVE-SACRAL ONE;  Surgeon: Bethann Goo, DO;  Location: [facility] OR;  Service: Neurosurgery;  Laterality: N/A;   TONSILLECTOMY      Social History   Tobacco Use  Smoking Status Never  Smokeless Tobacco Never    Social History   Substance and Sexual Activity  Alcohol Use No   Alcohol/week: 0.0  standard drinks of alcohol    Family History  Problem Relation Age of Onset   Heart attack Mother    Heart attack Father 43   Hypertension Brother    Cirrhosis Sister     Review of Systems: As noted in HPI.  All other systems were reviewed and are negative.  Physical Exam: There were no vitals taken for this visit. GENERAL:  Well appearing obese WM in NAD HEENT:  PERRL, EOMI, sclera are clear. Oropharynx is clear. NECK:  No jugular venous distention, carotid upstroke brisk and symmetric, no bruits, no thyromegaly or adenopathy LUNGS:  Clear to auscultation bilaterally CHEST:  Unremarkable HEART:  RRR,  PMI not displaced or sustained,S1 and S2 within normal limits, no S3, no S4: no clicks, no rubs, no  murmurs ABD:  Soft, nontender. BS +, no masses or bruits. No hepatomegaly, no splenomegaly EXT:  2 + pulses throughout, no edema, no cyanosis no clubbing SKIN:  Warm and dry.  No rashes NEURO:  Alert and oriented x 3. Cranial nerves II through XII intact. PSYCH:  Cognitively intact      LABORATORY DATA:  Labs dated 12/22/15: cholesterol 139, triglycerides 121, HDL 37, LDL 78. CBC, chemistries and TSH normal. Dated 12/28/16: cholesterol 151, triglycerides 147, HDL 39, LDL 83. Chemistries and hgb normal.  Dated 01/15/18: cholesterol 140, triglycerides 124, HDL 38, LDL 77.  Dated 02/21/18: creatinine 1.15.  Dated 04/25/18: normal Hgb.  Dated 02/04/20: cholesterol 144, triglycerides 139, HDL 37, LDL 81. CMET and TSH normal.  ECG today shows normal sinus rhythm with first degree AV block. RBBB. Unchanged. Rate 57. I have personally reviewed and interpreted this study.   Myoview 08/21/15:  Notes Recorded by Red Mandt M [place], MD on 08/21/2015 at 2:36 PM I reviewed his stress Myoview and compared it to the one in 2012. I really don't think there is a significant change. The Ecg changes were there previously. No significant ischemia noted on perfusion imaging and EF is normal. Recommend continued medical therapy.  Magie Ciampa [place] MD, [facility]        Vitals   Height Weight BMI (Calculated)  5\' 9"  (1.753 m) 214 lb (97.1 kg) 31.7  Study Highlights   Nuclear stress EF: 56%. The left ventricular ejection fraction is normal (55-65%). Horizontal ST segment depression of 1 mm was noted during stress in the II, III, aVF, V5 and V6 leads. In recovery there was downsloping of the ST segments with T wave inversions in the same leads. There is a medium defect of moderate severity present in the apical anterior, apical lateral and apex location. The defect non-reversible. This is a high risk study due to ischemic ST changes noted on stress EKG as well as borderline TID of 1.21 which may indicate multivessel CAD.      Normal LE arterial dopplers in Nov 2021   Assessment / Plan: 1. CAD s/p CABG in 1999. Normal Myoview in July 2017. He does have symptoms of angina class 2. Continue ASA and statin therapy. On beta blocker. With anginal symptoms and need for possible spine surgery will arrange for a Lexiscan Myoview. If abnormal will need cardiac cath.   2. Hyperlipidemia. Ideally would like LDL < 70. LDLs have consistently been in the 80s. Will increase lipitor to 40 mg daily.   3. RBBB chronic.  Benign.   4. Family history of AAA. August 2017 in July 2021 was negative

## 2021-07-27 ENCOUNTER — Encounter: Payer: Self-pay | Admitting: Cardiology

## 2021-07-27 ENCOUNTER — Ambulatory Visit (INDEPENDENT_AMBULATORY_CARE_PROVIDER_SITE_OTHER): Payer: PPO | Admitting: Cardiology

## 2021-07-27 VITALS — BP 128/70 | HR 56 | Ht 69.0 in | Wt 195.2 lb

## 2021-07-27 DIAGNOSIS — Z951 Presence of aortocoronary bypass graft: Secondary | ICD-10-CM

## 2021-07-27 DIAGNOSIS — I25708 Atherosclerosis of coronary artery bypass graft(s), unspecified, with other forms of angina pectoris: Secondary | ICD-10-CM | POA: Diagnosis not present

## 2021-07-27 DIAGNOSIS — I4510 Unspecified right bundle-branch block: Secondary | ICD-10-CM | POA: Diagnosis not present

## 2021-07-27 DIAGNOSIS — E785 Hyperlipidemia, unspecified: Secondary | ICD-10-CM | POA: Diagnosis not present

## 2021-07-27 LAB — BASIC METABOLIC PANEL
BUN/Creatinine Ratio: 19 (ref 10–24)
BUN: 22 mg/dL (ref 8–27)
CO2: 24 mmol/L (ref 20–29)
Calcium: 9.6 mg/dL (ref 8.6–10.2)
Chloride: 105 mmol/L (ref 96–106)
Creatinine, Ser: 1.18 mg/dL (ref 0.76–1.27)
Glucose: 104 mg/dL — ABNORMAL HIGH (ref 70–99)
Potassium: 4.4 mmol/L (ref 3.5–5.2)
Sodium: 142 mmol/L (ref 134–144)
eGFR: 62 mL/min/{1.73_m2} (ref >59)

## 2021-07-27 LAB — CBC
Hematocrit: 38.4 % (ref 37.5–51.0)
Hemoglobin: 12.5 g/dL — ABNORMAL LOW (ref 13.0–17.7)
MCH: 30.2 pg (ref 26.6–33.0)
MCHC: 32.6 g/dL (ref 31.5–35.7)
MCV: 93 fL (ref 79–97)
Platelets: 157 10*3/uL (ref 150–450)
RBC: 4.14 x10E6/uL (ref 4.14–5.80)
RDW: 13.4 % (ref 11.6–15.4)
WBC: 5.6 10*3/uL (ref 3.4–10.8)

## 2021-07-27 MED ORDER — SODIUM CHLORIDE 0.9% FLUSH: 3.0000 mL | Freq: Two times a day (BID) | INTRAVENOUS | Status: AC

## 2021-07-27 NOTE — Patient Instructions (Signed)
  [facility] CARDIOVASCULAR DIVISION [facility] [address] [place] [address] Dept: [phone] Loc: [phone]  Henry Patrick  07/27/2021  You are scheduled for a Cardiac Catheterization on Tuesday, June 27 with Dr. Peter [place].  1. Please arrive at the Main Entrance A at [facility]: [address] [place], [place] [address] at 5:30 AM (This time is two hours before your procedure to ensure your preparation). Free valet parking service is available.   Special note: Every effort is made to have your procedure done on time. Please understand that emergencies sometimes delay scheduled procedures.  2. Diet: Do not eat solid foods after midnight.  You may have clear liquids until 5 AM upon the day of the procedure.  3. Labs: You will need to have blood drawn today  4. Medication instructions in preparation for your procedure:  TAKE 1/2 THE DOSE OF INSULIN THE MORNING OF THE PROCEDURE   On the morning of your procedure, take Aspirin and any morning medicines NOT listed above.  You may use sips of water.  5. Plan to go home the same day, you will only stay overnight if medically necessary. 6. You MUST have a responsible adult to drive you home. 7. An adult MUST be with you the first 24 hours after you arrive home. 8. Bring a current list of your medications, and the last time and date medication taken. 9. Bring ID and current insurance cards. 10.Please wear clothes that are easy to get on and off and wear slip-on shoes.  Thank you for allowing [place] to care for you!   --  Invasive Cardiovascular services   Your physician recommends that you schedule a follow-up appointment in: 1 MONTH WITH DR [place]

## 2021-08-09 ENCOUNTER — Telehealth: Payer: Self-pay | Admitting: *Deleted

## 2021-08-10 ENCOUNTER — Encounter (HOSPITAL_COMMUNITY): Payer: Self-pay | Admitting: Cardiology

## 2021-08-10 ENCOUNTER — Ambulatory Visit (HOSPITAL_COMMUNITY)
Admission: RE | Admit: 2021-08-10 | Discharge: 2021-08-10 | Disposition: A | Discharge status: Home / Self Care | Payer: PPO | Attending: Cardiology | Admitting: Cardiology

## 2021-08-10 ENCOUNTER — Other Ambulatory Visit: Payer: Self-pay

## 2021-08-10 ENCOUNTER — Ambulatory Visit (HOSPITAL_COMMUNITY)
Admission: RE | Disposition: A | Discharge status: Home / Self Care | Payer: Self-pay | Source: Home / Self Care | Attending: Cardiology

## 2021-08-10 DIAGNOSIS — I4510 Unspecified right bundle-branch block: Secondary | ICD-10-CM | POA: Diagnosis not present

## 2021-08-10 DIAGNOSIS — E669 Obesity, unspecified: Secondary | ICD-10-CM | POA: Diagnosis not present

## 2021-08-10 DIAGNOSIS — I2582 Chronic total occlusion of coronary artery: Secondary | ICD-10-CM | POA: Insufficient documentation

## 2021-08-10 DIAGNOSIS — Z6828 Body mass index (BMI) 28.0-28.9, adult: Secondary | ICD-10-CM | POA: Diagnosis not present

## 2021-08-10 DIAGNOSIS — I2584 Coronary atherosclerosis due to calcified coronary lesion: Secondary | ICD-10-CM | POA: Diagnosis not present

## 2021-08-10 DIAGNOSIS — I25118 Atherosclerotic heart disease of native coronary artery with other forms of angina pectoris: Secondary | ICD-10-CM

## 2021-08-10 DIAGNOSIS — E785 Hyperlipidemia, unspecified: Secondary | ICD-10-CM | POA: Diagnosis not present

## 2021-08-10 DIAGNOSIS — I25708 Atherosclerosis of coronary artery bypass graft(s), unspecified, with other forms of angina pectoris: Secondary | ICD-10-CM | POA: Diagnosis not present

## 2021-08-10 HISTORY — PX: LEFT HEART CATH AND CORS/GRAFTS ANGIOGRAPHY: CATH118250

## 2021-08-10 SURGERY — LEFT HEART CATH AND CORS/GRAFTS ANGIOGRAPHY
Anesthesia: LOCAL

## 2021-08-10 MED ORDER — ONDANSETRON HCL 4 MG/2ML IJ SOLN: 4.0000 mg | Freq: Four times a day (QID) | INTRAMUSCULAR | Status: DC | PRN

## 2021-08-10 MED ORDER — SODIUM CHLORIDE 0.9 % IV SOLN: 250.0000 mL | INTRAVENOUS | Status: DC | PRN

## 2021-08-10 MED ORDER — ASPIRIN 81 MG PO CHEW: 81.0000 mg | CHEWABLE_TABLET | ORAL | Status: DC

## 2021-08-10 MED ORDER — HEPARIN (PORCINE) IN NACL 1000-0.9 UT/500ML-% IV SOLN
INTRAVENOUS | Status: AC
  Filled 2021-08-10: qty 500

## 2021-08-10 MED ORDER — ACETAMINOPHEN 325 MG PO TABS: 650.0000 mg | ORAL_TABLET | ORAL | Status: DC | PRN

## 2021-08-10 MED ORDER — SODIUM CHLORIDE 0.9% FLUSH: 3.0000 mL | INTRAVENOUS | Status: DC | PRN

## 2021-08-10 MED ORDER — FENTANYL CITRATE (PF) 100 MCG/2ML IJ SOLN
INTRAMUSCULAR | Status: DC | PRN
  Administered 2021-08-10: 25 ug via INTRAVENOUS

## 2021-08-10 MED ORDER — LIDOCAINE HCL (PF) 1 % IJ SOLN
INTRAMUSCULAR | Status: DC | PRN
  Administered 2021-08-10: 5 mL via INTRADERMAL

## 2021-08-10 MED ORDER — HEPARIN SODIUM (PORCINE) 1000 UNIT/ML IJ SOLN
INTRAMUSCULAR | Status: DC | PRN
  Administered 2021-08-10: 4500 [IU] via INTRAVENOUS

## 2021-08-10 MED ORDER — SODIUM CHLORIDE 0.9% FLUSH: 3.0000 mL | Freq: Two times a day (BID) | INTRAVENOUS | Status: DC

## 2021-08-10 MED ORDER — MIDAZOLAM HCL 2 MG/2ML IJ SOLN
INTRAMUSCULAR | Status: DC | PRN
  Administered 2021-08-10: 1 mg via INTRAVENOUS

## 2021-08-10 MED ORDER — MIDAZOLAM HCL 2 MG/2ML IJ SOLN
INTRAMUSCULAR | Status: AC
  Filled 2021-08-10: qty 2

## 2021-08-10 MED ORDER — FENTANYL CITRATE (PF) 100 MCG/2ML IJ SOLN
INTRAMUSCULAR | Status: AC
  Filled 2021-08-10: qty 2

## 2021-08-10 MED ORDER — SODIUM CHLORIDE 0.9 % IV SOLN
250.0000 mL | INTRAVENOUS | Status: DC | PRN
Start: 2021-08-10 — End: 2021-08-10

## 2021-08-10 MED ORDER — HEPARIN SODIUM (PORCINE) 1000 UNIT/ML IJ SOLN
INTRAMUSCULAR | Status: AC
  Filled 2021-08-10: qty 10

## 2021-08-10 MED ORDER — LIDOCAINE HCL (PF) 1 % IJ SOLN
INTRAMUSCULAR | Status: AC
  Filled 2021-08-10: qty 30

## 2021-08-10 MED ORDER — IOHEXOL 350 MG/ML SOLN
INTRAVENOUS | Status: DC | PRN
  Administered 2021-08-10: 90 mL

## 2021-08-10 MED ORDER — VERAPAMIL HCL 2.5 MG/ML IV SOLN
INTRAVENOUS | Status: AC
  Filled 2021-08-10: qty 2

## 2021-08-10 MED ORDER — SODIUM CHLORIDE 0.9 % WEIGHT BASED INFUSION: 1.0000 mL/kg/h | INTRAVENOUS | Status: AC

## 2021-08-10 MED ORDER — VERAPAMIL HCL 2.5 MG/ML IV SOLN
INTRAVENOUS | Status: DC | PRN
  Administered 2021-08-10: 10 mL via INTRA_ARTERIAL

## 2021-08-10 MED ORDER — SODIUM CHLORIDE 0.9 % WEIGHT BASED INFUSION: 1.0000 mL/kg/h | INTRAVENOUS | Status: DC

## 2021-08-10 MED ORDER — HEPARIN (PORCINE) IN NACL 1000-0.9 UT/500ML-% IV SOLN
INTRAVENOUS | Status: DC | PRN
  Administered 2021-08-10 (×2): 500 mL

## 2021-08-10 MED ORDER — SODIUM CHLORIDE 0.9 % WEIGHT BASED INFUSION
3.0000 mL/kg/h | INTRAVENOUS | Status: AC
  Administered 2021-08-10: 3 mL/kg/h via INTRAVENOUS

## 2021-08-10 SURGICAL SUPPLY — 11 items
BAND ZEPHYR COMPRESS 30 LONG (HEMOSTASIS) ×1 IMPLANT
CATH INFINITI 5 FR IM (CATHETERS) ×1 IMPLANT
CATH INFINITI 5FR AL1 (CATHETERS) ×1 IMPLANT
CATH INFINITI 5FR MULTPACK ANG (CATHETERS) ×1 IMPLANT
GLIDESHEATH SLEND SS 6F .021 (SHEATH) ×1 IMPLANT
GUIDEWIRE INQWIRE 1.5J.035X260 (WIRE) IMPLANT
INQWIRE 1.5J .035X260CM (WIRE) ×2
KIT HEART LEFT (KITS) ×2 IMPLANT
PACK CARDIAC CATHETERIZATION (CUSTOM PROCEDURE TRAY) ×2 IMPLANT
TRANSDUCER W/STOPCOCK (MISCELLANEOUS) ×2 IMPLANT
TUBING CIL FLEX 10 FLL-RA (TUBING) ×2 IMPLANT

## 2021-08-11 ENCOUNTER — Telehealth: Payer: Self-pay | Admitting: Cardiology

## 2021-08-11 MED FILL — Heparin Sodium (Porcine) Inj 1000 Unit/ML: INTRAMUSCULAR | Qty: 10 | Status: AC

## 2021-08-11 NOTE — Telephone Encounter (Signed)
Spoke to patient he stated he had a cardiac cath yesterday.Stated left wrist at cath site has bruising today.No pain.No swelling.Cool to touch.Advised to monitor and call back if he develops pain or swelling.Advised no lifting with that arm until his follow up appointment with Dr.Jordan 7/17 at 8:30 am.

## 2021-08-11 NOTE — Telephone Encounter (Signed)
New Message:     Patient had Cath yesterday. He said he have blue bruising on his left arm where the needle was inserted.

## 2021-08-24 NOTE — Progress Notes (Signed)
Henry Patrick Date of Birth: 1940-10-31 Medical Record #[id number]  History of Present Illness: Mr. Gluth is seen today for follow up CAD. He has a history of coronary disease and is status post CABG in 1999. This included an LIMA graft to the LAD and a saphenous vein graft to the obtuse marginal vessel. Cath in 2006 showed grafts were patent. Myoview study in April 2012 was normal. Repeat Myoview in Dec 2022 was normal. He also has a history of dyslipidemia and obesity.    He has continued problems with his spinal stenosis. His major complaint is of leg and back pain. He does have spinal stenosis and neuropathy.  Now seeing Dr Jake Samples. He did have L4-S1 laminectomy in February 2023.    On follow up today he reports his back pain and sciatica are markedly improved. He does note when working in his yard he gets very tired after 10 minutes and has pain across his anterior chest and upper back. He also notes he has a hard time taking a shower with fatigue and SOB - has had to put a stool in his shower. Has not tried to take Ntg.   Because of these symptoms he underwent repeat cardiac cath. This showed no significant change compared to 2006 with patent LIMA to the LAD, SVG to OM3. Normal EDP and EF 50-55%. He had no complications from his procedure.   Current Outpatient Medications on File Prior to Visit  Medication Sig Dispense Refill   acetaminophen (TYLENOL) 500 MG tablet Take 1,000 mg by mouth every 6 (six) hours as needed for mild pain.     aspirin EC 81 MG tablet Take 1 tablet (81 mg total) by mouth daily. 90 tablet 3   atorvastatin (LIPITOR) 40 MG tablet Take 1 tablet (40 mg total) by mouth daily. 90 tablet 3   Bacillus Coagulans-Inulin (ALIGN PREBIOTIC-PROBIOTIC PO) Take 1 capsule by mouth daily.     Fish Oil-Cholecalciferol (FISH OIL + D3) 1000-1000 MG-UNIT CAPS Take 1,000 mg by mouth daily.     Lysine 500 MG TABS Take 500 mg by mouth daily.     metoprolol succinate (TOPROL-XL) 25 MG 24  hr tablet Take 25 mg by mouth daily. for blood pressure  0   Multiple Vitamin (MULTI-VITAMIN PO) Take 1 tablet by mouth daily.      olmesartan (BENICAR) 20 MG tablet Take 20 mg by mouth daily.     HYDROcodone-acetaminophen (NORCO/VICODIN) 5-325 MG tablet Take 1-2 tablets by mouth every 4 (four) hours as needed for moderate pain. (Patient not taking: Reported on 08/03/2021) 20 tablet 0   methocarbamol (ROBAXIN) 500 MG tablet Take 1 tablet (500 mg total) by mouth 4 (four) times daily. (Patient not taking: Reported on 07/27/2021) 90 tablet 1   Current Facility-Administered Medications on File Prior to Visit  Medication Dose Route Frequency Provider Last Rate Last Admin   sodium chloride flush (NS) 0.9 % injection 3 mL  3 mL Intravenous Q12H [place], Yaire Kreher M, MD        No Known Allergies  Past Medical History:  Diagnosis Date   Coronary artery disease    Atherosclerotic CAD   Dyslipidemia    History of dyslipidemia   History of arthritis    Hyperlipidemia    Obesity    Plantar fasciitis    Spinal stenosis     Past Surgical History:  Procedure Laterality Date   CARDIAC CATHETERIZATION  08/02/2004   ejection fraction estimated 60%   CORONARY ARTERY  BYPASS GRAFT  1999   x2 by Dr. [email or web address] [email or web address]   LEFT HEART CATH AND CORS/GRAFTS ANGIOGRAPHY N/A 08/10/2021   Procedure: LEFT HEART CATH AND CORS/GRAFTS ANGIOGRAPHY;  Surgeon: Swaziland, Dana Debo M, MD;  Location: [facility] INVASIVE CV LAB;  Service: Cardiovascular;  Laterality: N/A;   LUMBAR LAMINECTOMY/DECOMPRESSION MICRODISCECTOMY N/A 04/06/2021   Procedure: OPEN LAMINECTOMY, LUMBAR FOUR-FIVE, LUMBAR FIVE-SACRAL ONE;  Surgeon: Bethann Goo, DO;  Location: [facility] OR;  Service: Neurosurgery;  Laterality: N/A;   TONSILLECTOMY      Social History   Tobacco Use  Smoking Status Never  Smokeless Tobacco Never    Social History   Substance and Sexual Activity  Alcohol Use No   Alcohol/week: 0.0 standard drinks of alcohol    Family History   Problem Relation Age of Onset   Heart attack Mother    Heart attack Father 53   Hypertension Brother    Cirrhosis Sister     Review of Systems: As noted in HPI.  All other systems were reviewed and are negative.  Physical Exam: BP 118/68   Pulse 65   Ht 5\' 9"  (1.753 m)   Wt 195 lb 9.6 oz (88.7 kg)   SpO2 98%   BMI 28.89 kg/m  GENERAL:  Well appearing obese WM in NAD HEENT:  PERRL, EOMI, sclera are clear. Oropharynx is clear. NECK:  No jugular venous distention, carotid upstroke brisk and symmetric, no bruits, no thyromegaly or adenopathy LUNGS:  Clear to auscultation bilaterally CHEST:  Unremarkable HEART:  RRR,  PMI not displaced or sustained,S1 and S2 within normal limits, no S3, no S4: no clicks, no rubs, no murmurs ABD:  Soft, nontender. BS +, no masses or bruits. No hepatomegaly, no splenomegaly EXT:  2 + pulses throughout, no edema, no cyanosis no clubbing SKIN:  Warm and dry.  No rashes NEURO:  Alert and oriented x 3. Cranial nerves II through XII intact. PSYCH:  Cognitively intact      LABORATORY DATA:  Labs dated 12/22/15: cholesterol 139, triglycerides 121, HDL 37, LDL 78. CBC, chemistries and TSH normal. Dated 12/28/16: cholesterol 151, triglycerides 147, HDL 39, LDL 83. Chemistries and hgb normal.  Dated 01/15/18: cholesterol 140, triglycerides 124, HDL 38, LDL 77.  Dated 02/21/18: creatinine 1.15.  Dated 04/25/18: normal Hgb.  Dated 02/04/20: cholesterol 144, triglycerides 139, HDL 37, LDL 81. CMET and TSH normal.  ECG not done today   Myoview 01/28/21: Study Highlights      The study is normal. Findings are consistent with no prior ischemia. The study is low risk.   No ST deviation was noted.   Left ventricular function is normal. Nuclear stress EF: 61 %. The left ventricular ejection fraction is normal (55-65%). End diastolic cavity size is normal.   Prior study available for comparison from 08/21/2015.    Normal LE arterial dopplers in Nov  2021  Cardiac cath 08/10/21:  LEFT HEART CATH AND CORS/GRAFTS ANGIOGRAPHY   Conclusion      Prox RCA lesion is 30% stenosed.   Ost LM to Mid LM lesion is 40% stenosed.   Prox LAD lesion is 90% stenosed.   Mid LAD lesion is 100% stenosed.   Prox Cx to Dist Cx lesion is 80% stenosed.   Mid Graft lesion is 30% stenosed.   SVG graft was visualized by angiography and is normal in caliber.   LIMA graft was visualized by angiography and is normal in caliber.   The graft exhibits no disease.   There is mild  left ventricular systolic dysfunction.   LV end diastolic pressure is normal.   The left ventricular ejection fraction is 50-55% by visual estimate.   2 vessel obstructive CAD Patent LIMA to the LAD Patent SVG to OM3 Mild LV dysfunction. EF 50-55% Normal LVEDP   Plan: no change compared to prior cardiac cath in 2006. Continue medical management.    Coronary Diagrams  Diagnostic Dominance: Right  Intervention   Assessment / Plan: 1. CAD s/p CABG in 1999. Normal Myoview in Dec 2022. Recent cardiac cath shows good patency of bypass grafts and no other new obstruction. Continue medical therapy. Follow up in one  year.  2. Hyperlipidemia. Ideally would like LDL < 70. Labs followed by PCP  3. RBBB chronic.  Benign.   4. Family history of AAA. [place] in July 2021 was negative

## 2021-08-30 ENCOUNTER — Ambulatory Visit: Payer: PPO | Admitting: Cardiology

## 2021-08-30 ENCOUNTER — Encounter: Payer: Self-pay | Admitting: Cardiology

## 2021-08-30 VITALS — BP 118/68 | HR 65 | Ht 69.0 in | Wt 195.6 lb

## 2021-08-30 DIAGNOSIS — Z951 Presence of aortocoronary bypass graft: Secondary | ICD-10-CM | POA: Diagnosis not present

## 2021-08-30 DIAGNOSIS — I25708 Atherosclerosis of coronary artery bypass graft(s), unspecified, with other forms of angina pectoris: Secondary | ICD-10-CM | POA: Diagnosis not present

## 2021-08-30 DIAGNOSIS — E7800 Pure hypercholesterolemia, unspecified: Secondary | ICD-10-CM

## 2021-08-30 DIAGNOSIS — I4510 Unspecified right bundle-branch block: Secondary | ICD-10-CM | POA: Diagnosis not present

## 2021-09-29 DIAGNOSIS — Z6828 Body mass index (BMI) 28.0-28.9, adult: Secondary | ICD-10-CM | POA: Diagnosis not present

## 2021-09-29 DIAGNOSIS — M48062 Spinal stenosis, lumbar region with neurogenic claudication: Secondary | ICD-10-CM | POA: Diagnosis not present

## 2022-02-09 ENCOUNTER — Other Ambulatory Visit: Payer: Self-pay | Admitting: Cardiology

## 2022-02-10 ENCOUNTER — Telehealth: Payer: Self-pay | Admitting: Cardiology

## 2022-02-10 NOTE — Telephone Encounter (Signed)
Pt called asking to speak to Dr. Swaziland about his symptoms. Informed him that Dr. Swaziland is not in this week. He states that Cristmas night he got cold sweats and shaky at night. He stated that his legs, lower back, spine, and neck hurt and got weak and lasted 30 minutes. He put a heating pad on to warm him up and that worked however pt experienced these same symptoms again last night. He states it was also very hard for him to catch his breath. Pt is concerned about this matter and wanted to speak to someone. I advised pt that he could get opinion of DOD today to see what he should do and that RN will c/b as well.

## 2022-02-10 NOTE — Telephone Encounter (Signed)
Contacted patient, advised that he has been having the symptoms listed below- he mentioned cold sweats, feeling shaky, fatigued, he states it takes him about 30 minutes to get over these episodes. He states he last had one about 1.5 years ago. Dr.Jordan did testing- all was normal. He states it was hard for him to catch his breath (he sounds like he was anxious and concerned during this, which could be the leading factor in the breathing). He states he is feeling fine today- I did ask if he was eating well, these episodes happen mostly at night, and could be related to possible hypoglycemia? He states he will try and eat/drink during the episodes to see if it improves. I also asked that he check his BP/HR too- he is on BP medications and does not check it at home. He was advised to also contact his PCP to advise of symptoms as well and see what they recommend.   I did go ahead and schedule for a in office follow up next week with NP at the request of patient. Patient verbalized understanding. Will route to MD/NP seeing patient to see if they recommend anything else.

## 2022-02-15 NOTE — Telephone Encounter (Addendum)
Called patient left message on personal voice mail to keep appointment already scheduled with Diona Browner NP 1/4 at 2:45 pm.

## 2022-02-16 NOTE — Progress Notes (Unsigned)
Cardiology Clinic Note   Patient Name: Ollie Esty Rosten Date of Encounter: 02/17/2022  Primary Care Provider:  Garlan Fillers, MD Primary Cardiologist:  Peter Swaziland, MD  Patient Profile    Tome Wilson Forand is a 82 y.o. male with a past medical history of CAD s/p CABG x 2 1999, hyperlipidemia, RBBB who presents to the clinic today for follow-up.  Past Medical History    Past Medical History:  Diagnosis Date   Coronary artery disease    Atherosclerotic CAD   Dyslipidemia    History of dyslipidemia   History of arthritis    Hyperlipidemia    Obesity    Plantar fasciitis    Spinal stenosis    Past Surgical History:  Procedure Laterality Date   CARDIAC CATHETERIZATION  08/02/2004   ejection fraction estimated 60%   CORONARY ARTERY BYPASS GRAFT  1999   x2 by Dr. [email or web address] [email or web address]   LEFT HEART CATH AND CORS/GRAFTS ANGIOGRAPHY N/A 08/10/2021   Procedure: LEFT HEART CATH AND CORS/GRAFTS ANGIOGRAPHY;  Surgeon: Swaziland, Peter M, MD;  Location: [facility] INVASIVE CV LAB;  Service: Cardiovascular;  Laterality: N/A;   LUMBAR LAMINECTOMY/DECOMPRESSION MICRODISCECTOMY N/A 04/06/2021   Procedure: OPEN LAMINECTOMY, LUMBAR FOUR-FIVE, LUMBAR FIVE-SACRAL ONE;  Surgeon: Bethann Goo, DO;  Location: [facility] OR;  Service: Neurosurgery;  Laterality: N/A;   TONSILLECTOMY      Allergies  No Known Allergies  History of Present Illness    Neeko A Dinino has a past medical history of: CAD. CABG x 2 1999: LIMA to LAD SVG to OM. LHC 2006: Patent grafts. LHC 08/10/2021: Proximal RCA 30%.  Ostial to mid LM 40%.  Proximal LAD 90%.  Mid LAD 100%.  Proximal to distal Cx 80%.  Mid graft lesion 30%.  Normal caliber graft without disease.  Patent grafts.  Unchanged from previous LHC. Hyperlipidemia. Lipid panel 06/24/2021: LDL 74, HDL 36, TG 146, total 139. RBBB.  Mr. Marchuk is a longtime patient of cardiology.  He has a history of CABG x 2 in 1999 with LIMA to LAD and SVG to OM. LHC in 2006 showed patent  stents.  In June 2023 patient complained of getting fatigued easily during yard work with pain across chest and upper back.  He put his stool in shower secondary to fatigue and shortness of breath with showering. LHC showed 2 vessel obstructive CAD with patent grafts. Patient was last seen in the office by Dr. Sw[place]iland for cath follow-up on 08/30/2021. No changes were made and one year follow-up was planned.   Most recently patient called into the office on 02/10/2022 with reports of 2 episodes of feeling cold, shaky, and fatigued along with pain in legs, lower back, spine and neck that lasted about 30 minutes.  He used a heating pad to warm up and that seemed to help resolve symptoms.  These episodes occurred on 12/25 and 12/27.  Triage nurse contacted patient to discuss symptoms and questioned if episodes could be related to hypoglycemia.  He was instructed if he has a repeat episode to try to eat or drink something and check BP and heart rate.  Today, patient accompanied by his wife.  Reports no further episodes since 02/09/2022.  He denies doing anything different on the days the episodes occurred.  He ate his normal diet, he pays attention to his hydration, and he has not been sick recently.  He was fully cognitive without headache, dizziness, or slurred speech during the episodes and his wife confirms this.  He states he had a similar episode 1.5 years ago for which she called EMS and was evaluated in the ED.  All of his testing was negative and he had no repeat episodes until now.  Patient denies shortness of breath or dyspnea on exertion. No chest pain, pressure, or tightness. Denies orthopnea or PND. No palpitations.  He does have chronic lower extremity swelling left greater than right.  He reports that the left lower extremity is always bigger since his CABG in 1999.   Home Medications    Current Meds  Medication Sig   acetaminophen (TYLENOL) 500 MG tablet Take 1,000 mg by mouth every 6 (six)  hours as needed for mild pain.   aspirin EC 81 MG tablet Take 1 tablet (81 mg total) by mouth daily.   atorvastatin (LIPITOR) 40 MG tablet Take 1 tablet (40 mg total) by mouth daily.   Fish Oil-Cholecalciferol (FISH OIL + D3) 1000-1000 MG-UNIT CAPS Take 1,000 mg by mouth daily.   HYDROcodone-acetaminophen (NORCO/VICODIN) 5-325 MG tablet Take 1-2 tablets by mouth every 4 (four) hours as needed for moderate pain.   Lysine 500 MG TABS Take 500 mg by mouth daily.   methocarbamol (ROBAXIN) 500 MG tablet Take 1 tablet (500 mg total) by mouth 4 (four) times daily.   metoprolol succinate (TOPROL-XL) 25 MG 24 hr tablet Take 25 mg by mouth daily. for blood pressure   Multiple Vitamin (MULTI-VITAMIN PO) Take 1 tablet by mouth daily.    naproxen sodium (ALEVE) 220 MG tablet Take 220 mg by mouth as needed.   olmesartan (BENICAR) 20 MG tablet Take 20 mg by mouth daily.   Probiotic Product (ALIGN PO) Take 1 Capful by mouth daily.   [DISCONTINUED] Bacillus Coagulans-Inulin (ALIGN PREBIOTIC-PROBIOTIC PO) Take 1 capsule by mouth daily.   Current Facility-Administered Medications for the 02/17/22 encounter (Office Visit) with Mayra Reel, NP  Medication   sodium chloride flush (NS) 0.9 % injection 3 mL    Family History    Family History  Problem Relation Age of Onset   Heart attack Mother    Heart attack Father 72   Hypertension Brother    Cirrhosis Sister    He indicated that his mother is deceased. He indicated that his father is deceased. He indicated that two of his three sisters are alive. He indicated that his brother is alive. He indicated that his maternal grandmother is deceased. He indicated that his maternal grandfather is deceased. He indicated that his paternal grandmother is deceased. He indicated that his paternal grandfather is deceased.   Social History    Social History   Socioeconomic History   Marital status: Married    Spouse name: Not on file   Number of children: Not  on file   Years of education: Not on file   Highest education level: Not on file  Occupational History   Not on file  Tobacco Use   Smoking status: Never   Smokeless tobacco: Never  Vaping Use   Vaping Use: Never used  Substance and Sexual Activity   Alcohol use: No    Alcohol/week: 0.0 standard drinks of alcohol   Drug use: No   Sexual activity: Not on file  Other Topics Concern   Not on file  Social History Narrative   Not on file   Social Determinants of Health   Financial Resource Strain: Not on file  Food Insecurity: Not on file  Transportation Needs: Not on file  Physical Activity: Not on file  Stress: Not on file  Social Connections: Not on file  Intimate Partner Violence: Not on file     Review of Systems    General:  No chills, fever, night sweats or weight changes.  Episode x 2 of feeling cold, shaky and fatigued. Cardiovascular:  No chest pain, dyspnea on exertion, edema, orthopnea, palpitations, paroxysmal nocturnal dyspnea. Dermatological: No rash, lesions/masses Respiratory: No cough, dyspnea Urologic: No hematuria, dysuria Abdominal:   No nausea, vomiting, diarrhea, bright red blood per rectum, melena, or hematemesis Neurologic:  No visual changes, weakness, changes in mental status. All other systems reviewed and are otherwise negative except as noted above.  Physical Exam    VS:  BP 126/68 (BP Location: Right Arm, Patient Position: Sitting, Cuff Size: Large)   Pulse 62   Ht 5\' 9"  (1.753 m)   Wt 195 lb (88.5 kg)   BMI 28.80 kg/m  , BMI Body mass index is 28.8 kg/m. GEN:  Well nourished, well developed, in no acute distress. HEENT: Normal. Neck: Supple, no JVD, carotid bruits, or masses. Cardiac: RRR, no murmurs, rubs, or gallops. No clubbing, cyanosis, edema.  Radials/DP/PT 2+ and equal bilaterally.  Respiratory:  Respirations regular and unlabored, clear to auscultation bilaterally. GI: Soft, nontender, nondistended. MS: No deformity or  atrophy. Skin: Warm and dry, no rash. Neuro: Strength and sensation are intact. Psych: Normal affect.  Accessory Clinical Findings    Recent Labs: 07/27/2021: BUN 22; Creatinine, Ser 1.18; Hemoglobin 12.5; Platelets 157; Potassium 4.4; Sodium 142   Recent Lipid Panel No results found for: "CHOL", "TRIG", "HDL", "CHOLHDL", "VLDL", "LDLCALC", "LDLDIRECT"     ECG is not indicated today.  Assessment & Plan   Shakiness/fatigue.  Patient describes 2 episodes on 12/25 and 12/27 of feeling cold, shaky, and fatigued with pain from neck down his spine into his legs.  He did not do anything different that day and not been sick recently.  He was fully cognitive without headache, dizziness or slurred speech during the episode.  No episodes since.  He did not check his BP or heart rate at that time.  Question possible hypoglycemia.  Will get CBC, BMP, and TSH today.  Instructed patient to contact PCP about these episodes for possible workup for hypoglycemia.  Etiology unclear at this time, patient and wife are instructed to check BP and heart if repeat episode. He can also try drinking some juice or something with sugar to see if this helps resolve his symptoms.  CAD. S/p CABG x 2 in 1999.  LHC June 2023 showed two-vessel obstructive CAD with patent grafts.  Patient denies chest pain, tightness, pressure.  No shortness of breath or DOE.  Continue aspirin, Lipitor, and metoprolol.     Disposition: CBC, BMP, TSH today.  Return in 6 to 8 weeks or sooner as needed.  Justice Britain. Armanie Martine, DNP, NP-C     02/17/2022, 3:01 PM [address] Office [phone] Fax [phone]

## 2022-02-17 ENCOUNTER — Ambulatory Visit: Payer: PPO | Attending: Nurse Practitioner | Admitting: Student

## 2022-02-17 ENCOUNTER — Encounter: Payer: Self-pay | Admitting: Nurse Practitioner

## 2022-02-17 VITALS — BP 126/68 | HR 62 | Ht 69.0 in | Wt 195.0 lb

## 2022-02-17 DIAGNOSIS — I25708 Atherosclerosis of coronary artery bypass graft(s), unspecified, with other forms of angina pectoris: Secondary | ICD-10-CM

## 2022-02-17 DIAGNOSIS — R251 Tremor, unspecified: Secondary | ICD-10-CM

## 2022-02-17 DIAGNOSIS — R5383 Other fatigue: Secondary | ICD-10-CM

## 2022-02-17 DIAGNOSIS — Z79899 Other long term (current) drug therapy: Secondary | ICD-10-CM

## 2022-02-17 NOTE — Patient Instructions (Addendum)
Medication Instructions:  Your physician recommends that you continue on your current medications as directed. Please refer to the Current Medication list given to you today.  *If you need a refill on your cardiac medications before your next appointment, please call your pharmacy*   Lab Work: Your physician recommends that you complete lab work today. CBC, BMET, TSH  If you have labs (blood work) drawn today and your tests are completely normal, you will receive your results only by: [place] (if you have MyChart) OR A paper copy in the mail If you have any lab test that is abnormal or we need to change your treatment, we will call you to review the results.   Testing/Procedures: NONE ordered at this time of appointment     Follow-Up: At [facility], you and your health needs are our priority.  As part of our continuing mission to provide you with exceptional heart care, we have created designated Provider Care Teams.  These Care Teams include your primary Cardiologist (physician) and Advanced Practice Providers (APPs -  Physician Assistants and Nurse Practitioners) who all work together to provide you with the care you need, when you need it.  We recommend signing up for the patient portal called "MyChart".  Sign up information is provided on this After Visit Summary.  MyChart is used to connect with patients for Virtual Visits (Telemedicine).  Patients are able to view lab/test results, encounter notes, upcoming appointments, etc.  Non-urgent messages can be sent to your provider as well.   To learn more about what you can do with MyChart, go to [email or web address].    Your next appointment:   1 month(s)  The format for your next appointment:   In Person  Provider:   Mayra Reel NP or Diona Browner NP    Other Instructions   Important Information About Sugar

## 2022-02-18 ENCOUNTER — Telehealth: Payer: Self-pay

## 2022-02-18 LAB — CBC
Hematocrit: 41 % (ref 37.5–51.0)
Hemoglobin: 13.6 g/dL (ref 13.0–17.7)
MCH: 30.6 pg (ref 26.6–33.0)
MCHC: 33.2 g/dL (ref 31.5–35.7)
MCV: 92 fL (ref 79–97)
Platelets: 186 10*3/uL (ref 150–450)
RBC: 4.45 x10E6/uL (ref 4.14–5.80)
RDW: 12.2 % (ref 11.6–15.4)
WBC: 7.3 10*3/uL (ref 3.4–10.8)

## 2022-02-18 LAB — BASIC METABOLIC PANEL
BUN/Creatinine Ratio: 20 (ref 10–24)
BUN: 19 mg/dL (ref 8–27)
CO2: 20 mmol/L (ref 20–29)
Calcium: 9.4 mg/dL (ref 8.6–10.2)
Chloride: 102 mmol/L (ref 96–106)
Creatinine, Ser: 0.94 mg/dL (ref 0.76–1.27)
Glucose: 93 mg/dL (ref 70–99)
Potassium: 4.3 mmol/L (ref 3.5–5.2)
Sodium: 139 mmol/L (ref 134–144)
eGFR: 80 mL/min/{1.73_m2} (ref >59)

## 2022-02-18 LAB — TSH: TSH: 2.12 u[IU]/mL (ref 0.450–4.500)

## 2022-02-18 NOTE — Progress Notes (Signed)
I sent a message directly to patient in My Chart. Thank you! ~DW

## 2022-02-18 NOTE — Telephone Encounter (Signed)
Lmom to discuss lab results. Waiting on a return call.  

## 2022-02-25 DIAGNOSIS — I10 Essential (primary) hypertension: Secondary | ICD-10-CM | POA: Diagnosis not present

## 2022-02-25 DIAGNOSIS — E785 Hyperlipidemia, unspecified: Secondary | ICD-10-CM | POA: Diagnosis not present

## 2022-02-25 DIAGNOSIS — R152 Fecal urgency: Secondary | ICD-10-CM | POA: Diagnosis not present

## 2022-02-25 DIAGNOSIS — I2510 Atherosclerotic heart disease of native coronary artery without angina pectoris: Secondary | ICD-10-CM | POA: Diagnosis not present

## 2022-02-25 DIAGNOSIS — I872 Venous insufficiency (chronic) (peripheral): Secondary | ICD-10-CM | POA: Diagnosis not present

## 2022-02-25 DIAGNOSIS — R61 Generalized hyperhidrosis: Secondary | ICD-10-CM | POA: Diagnosis not present

## 2022-03-02 DIAGNOSIS — R61 Generalized hyperhidrosis: Secondary | ICD-10-CM | POA: Diagnosis not present

## 2022-03-02 DIAGNOSIS — I2510 Atherosclerotic heart disease of native coronary artery without angina pectoris: Secondary | ICD-10-CM | POA: Diagnosis not present

## 2022-03-02 DIAGNOSIS — I10 Essential (primary) hypertension: Secondary | ICD-10-CM | POA: Diagnosis not present

## 2022-03-08 DIAGNOSIS — I10 Essential (primary) hypertension: Secondary | ICD-10-CM | POA: Diagnosis not present

## 2022-03-08 DIAGNOSIS — I2510 Atherosclerotic heart disease of native coronary artery without angina pectoris: Secondary | ICD-10-CM | POA: Diagnosis not present

## 2022-03-08 DIAGNOSIS — R42 Dizziness and giddiness: Secondary | ICD-10-CM | POA: Diagnosis not present

## 2022-03-08 DIAGNOSIS — I872 Venous insufficiency (chronic) (peripheral): Secondary | ICD-10-CM | POA: Diagnosis not present

## 2022-03-14 DIAGNOSIS — R61 Generalized hyperhidrosis: Secondary | ICD-10-CM | POA: Diagnosis not present

## 2022-04-14 ENCOUNTER — Ambulatory Visit: Payer: PPO | Admitting: Physician Assistant

## 2022-08-04 ENCOUNTER — Other Ambulatory Visit: Payer: Self-pay | Admitting: Cardiology

## 2022-09-05 DIAGNOSIS — M5136 Other intervertebral disc degeneration, lumbar region: Secondary | ICD-10-CM | POA: Diagnosis not present

## 2022-09-05 DIAGNOSIS — M542 Cervicalgia: Secondary | ICD-10-CM | POA: Diagnosis not present

## 2022-09-05 DIAGNOSIS — M48061 Spinal stenosis, lumbar region without neurogenic claudication: Secondary | ICD-10-CM | POA: Diagnosis not present

## 2022-09-05 DIAGNOSIS — M47816 Spondylosis without myelopathy or radiculopathy, lumbar region: Secondary | ICD-10-CM | POA: Diagnosis not present

## 2022-09-06 DIAGNOSIS — D509 Iron deficiency anemia, unspecified: Secondary | ICD-10-CM | POA: Diagnosis not present

## 2022-09-06 DIAGNOSIS — E611 Iron deficiency: Secondary | ICD-10-CM | POA: Diagnosis not present

## 2022-09-06 DIAGNOSIS — Z125 Encounter for screening for malignant neoplasm of prostate: Secondary | ICD-10-CM | POA: Diagnosis not present

## 2022-09-06 DIAGNOSIS — E785 Hyperlipidemia, unspecified: Secondary | ICD-10-CM | POA: Diagnosis not present

## 2022-09-06 DIAGNOSIS — I10 Essential (primary) hypertension: Secondary | ICD-10-CM | POA: Diagnosis not present

## 2022-09-06 DIAGNOSIS — I2510 Atherosclerotic heart disease of native coronary artery without angina pectoris: Secondary | ICD-10-CM | POA: Diagnosis not present

## 2022-09-13 DIAGNOSIS — Z1339 Encounter for screening examination for other mental health and behavioral disorders: Secondary | ICD-10-CM | POA: Diagnosis not present

## 2022-09-13 DIAGNOSIS — Z0000 Encounter for general adult medical examination without abnormal findings: Secondary | ICD-10-CM | POA: Diagnosis not present

## 2022-09-13 DIAGNOSIS — M542 Cervicalgia: Secondary | ICD-10-CM | POA: Diagnosis not present

## 2022-09-13 DIAGNOSIS — Z1331 Encounter for screening for depression: Secondary | ICD-10-CM | POA: Diagnosis not present

## 2022-09-13 DIAGNOSIS — M7918 Myalgia, other site: Secondary | ICD-10-CM | POA: Diagnosis not present

## 2022-09-13 DIAGNOSIS — R82998 Other abnormal findings in urine: Secondary | ICD-10-CM | POA: Diagnosis not present

## 2022-09-13 DIAGNOSIS — I872 Venous insufficiency (chronic) (peripheral): Secondary | ICD-10-CM | POA: Diagnosis not present

## 2022-09-13 DIAGNOSIS — E785 Hyperlipidemia, unspecified: Secondary | ICD-10-CM | POA: Diagnosis not present

## 2022-09-13 DIAGNOSIS — I2510 Atherosclerotic heart disease of native coronary artery without angina pectoris: Secondary | ICD-10-CM | POA: Diagnosis not present

## 2022-09-13 DIAGNOSIS — I10 Essential (primary) hypertension: Secondary | ICD-10-CM | POA: Diagnosis not present

## 2022-10-06 NOTE — Progress Notes (Unsigned)
Ethel Rana Winterrowd Date of Birth: May 22, 1940 Medical Record #[id number]  History of Present Illness: [name] is seen today for follow up CAD. He has a history of coronary disease and is status post CABG in 1999. This included an LIMA graft to the LAD and a saphenous vein graft to the obtuse marginal vessel. Cath in 2006 showed grafts were patent. Myoview study in April 2012 was normal. Repeat Myoview in Dec 2022 was normal. He also has a history of dyslipidemia and obesity.    He has continued problems with his spinal stenosis. His major complaint is of leg and back pain. He does have spinal stenosis and neuropathy.  Now seeing Dr Jake Samples. He did have L4-S1 laminectomy in February 2023.    On follow up today he reports his back pain and sciatica are markedly improved. He does note when working in his yard he gets very tired after 10 minutes and has pain across his anterior chest and upper back. He also notes he has a hard time taking a shower with fatigue and SOB - has had to put a stool in his shower. Has not tried to take Ntg.   Because of these symptoms he underwent repeat cardiac cath. This showed no significant change compared to 2006 with patent LIMA to the LAD, SVG to OM3. Normal EDP and EF 50-55%. He had no complications from his procedure.   Current Outpatient Medications on File Prior to Visit  Medication Sig Dispense Refill   acetaminophen (TYLENOL) 500 MG tablet Take 1,000 mg by mouth every 6 (six) hours as needed for mild pain.     aspirin EC 81 MG tablet Take 1 tablet (81 mg total) by mouth daily. 90 tablet 3   atorvastatin (LIPITOR) 40 MG tablet TAKE 1 TABLET BY MOUTH EVERY DAY 90 tablet 1   Fish Oil-Cholecalciferol (FISH OIL + D3) 1000-1000 MG-UNIT CAPS Take 1,000 mg by mouth daily.     Lysine 500 MG TABS Take 500 mg by mouth daily.     methocarbamol (ROBAXIN) 500 MG tablet Take 1 tablet (500 mg total) by mouth 4 (four) times daily. 90 tablet 1   metoprolol succinate (TOPROL-XL)  25 MG 24 hr tablet Take 25 mg by mouth daily. for blood pressure  0   Multiple Vitamin (MULTI-VITAMIN PO) Take 1 tablet by mouth daily.      naproxen sodium (ALEVE) 220 MG tablet Take 220 mg by mouth as needed.     olmesartan (BENICAR) 20 MG tablet Take 20 mg by mouth daily.     Probiotic Product (ALIGN PO) Take 1 Capful by mouth daily.     Current Facility-Administered Medications on File Prior to Visit  Medication Dose Route Frequency Provider Last Rate Last Admin   sodium chloride flush (NS) 0.9 % injection 3 mL  3 mL Intravenous Q12H [place], Louana Fontenot M, MD        No Known Allergies  Past Medical History:  Diagnosis Date   Coronary artery disease    Atherosclerotic CAD   Dyslipidemia    History of dyslipidemia   History of arthritis    Hyperlipidemia    Obesity    Plantar fasciitis    Spinal stenosis     Past Surgical History:  Procedure Laterality Date   CARDIAC CATHETERIZATION  08/02/2004   ejection fraction estimated 60%   CORONARY ARTERY BYPASS GRAFT  1999   x2 by Dr. [email or web address] [email or web address]   LEFT HEART CATH AND CORS/GRAFTS ANGIOGRAPHY N/A 08/10/2021  Procedure: LEFT HEART CATH AND CORS/GRAFTS ANGIOGRAPHY;  Surgeon: [place], Parker Wherley M, MD;  Location: [facility] INVASIVE CV LAB;  Service: Cardiovascular;  Laterality: N/A;   LUMBAR LAMINECTOMY/DECOMPRESSION MICRODISCECTOMY N/A 04/06/2021   Procedure: OPEN LAMINECTOMY, LUMBAR FOUR-FIVE, LUMBAR FIVE-SACRAL ONE;  Surgeon: Bethann Goo, DO;  Location: [facility] OR;  Service: Neurosurgery;  Laterality: N/A;   TONSILLECTOMY      Social History   Tobacco Use  Smoking Status Never  Smokeless Tobacco Never    Social History   Substance and Sexual Activity  Alcohol Use No   Alcohol/week: 0.0 standard drinks of alcohol    Family History  Problem Relation Age of Onset   Heart attack Mother    Heart attack Father 80   Hypertension Brother    Cirrhosis Sister     Review of Systems: As noted in HPI.  All other systems were reviewed  and are negative.  Physical Exam: There were no vitals taken for this visit. GENERAL:  Well appearing obese WM in NAD HEENT:  PERRL, EOMI, sclera are clear. Oropharynx is clear. NECK:  No jugular venous distention, carotid upstroke brisk and symmetric, no bruits, no thyromegaly or adenopathy LUNGS:  Clear to auscultation bilaterally CHEST:  Unremarkable HEART:  RRR,  PMI not displaced or sustained,S1 and S2 within normal limits, no S3, no S4: no clicks, no rubs, no murmurs ABD:  Soft, nontender. BS +, no masses or bruits. No hepatomegaly, no splenomegaly EXT:  2 + pulses throughout, no edema, no cyanosis no clubbing SKIN:  Warm and dry.  No rashes NEURO:  Alert and oriented x 3. Cranial nerves II through XII intact. PSYCH:  Cognitively intact      LABORATORY DATA:  Labs dated 12/22/15: cholesterol 139, triglycerides 121, HDL 37, LDL 78. CBC, chemistries and TSH normal. Dated 12/28/16: cholesterol 151, triglycerides 147, HDL 39, LDL 83. Chemistries and hgb normal.  Dated 01/15/18: cholesterol 140, triglycerides 124, HDL 38, LDL 77.  Dated 02/21/18: creatinine 1.15.  Dated 04/25/18: normal Hgb.  Dated 02/04/20: cholesterol 144, triglycerides 139, HDL 37, LDL 81. CMET and TSH normal. Dated 06/24/21: cholesterol 139, triglycerides 146, HDL 36, LDL 74. CMET normal.   ECG not done today   Myoview 01/28/21: Study Highlights      The study is normal. Findings are consistent with no prior ischemia. The study is low risk.   No ST deviation was noted.   Left ventricular function is normal. Nuclear stress EF: 61 %. The left ventricular ejection fraction is normal (55-65%). End diastolic cavity size is normal.   Prior study available for comparison from 08/21/2015.    Normal LE arterial dopplers in Nov 2021  Cardiac cath 08/10/21:  LEFT HEART CATH AND CORS/GRAFTS ANGIOGRAPHY   Conclusion      Prox RCA lesion is 30% stenosed.   Ost LM to Mid LM lesion is 40% stenosed.   Prox LAD lesion is  90% stenosed.   Mid LAD lesion is 100% stenosed.   Prox Cx to Dist Cx lesion is 80% stenosed.   Mid Graft lesion is 30% stenosed.   SVG graft was visualized by angiography and is normal in caliber.   LIMA graft was visualized by angiography and is normal in caliber.   The graft exhibits no disease.   There is mild left ventricular systolic dysfunction.   LV end diastolic pressure is normal.   The left ventricular ejection fraction is 50-55% by visual estimate.   2 vessel obstructive CAD Patent LIMA to  the LAD Patent SVG to OM3 Mild LV dysfunction. EF 50-55% Normal LVEDP   Plan: no change compared to prior cardiac cath in 2006. Continue medical management.    Coronary Diagrams  Diagnostic Dominance: Right  Intervention   Assessment / Plan: 1. CAD s/p CABG in 1999. Normal Myoview in Dec 2022. Cardiac cath June 2023 showed good patency of bypass grafts and no other new obstruction. Continue medical therapy. Follow up in one  year.  2. Hyperlipidemia. Ideally would like LDL < 70. Labs followed by PCP  3. RBBB chronic.  Benign.   4. Family history of AAA. [place] in July 2021 was negative

## 2022-10-11 ENCOUNTER — Ambulatory Visit: Payer: PPO | Attending: Cardiology | Admitting: Cardiology

## 2022-10-11 ENCOUNTER — Encounter: Payer: Self-pay | Admitting: Cardiology

## 2022-10-11 VITALS — BP 112/70 | HR 58 | Ht 69.0 in | Wt 188.0 lb

## 2022-10-11 DIAGNOSIS — I25708 Atherosclerosis of coronary artery bypass graft(s), unspecified, with other forms of angina pectoris: Secondary | ICD-10-CM | POA: Diagnosis not present

## 2022-10-11 DIAGNOSIS — Z951 Presence of aortocoronary bypass graft: Secondary | ICD-10-CM

## 2022-10-11 DIAGNOSIS — Z79899 Other long term (current) drug therapy: Secondary | ICD-10-CM | POA: Diagnosis not present

## 2022-10-11 NOTE — Patient Instructions (Signed)
Medication Instructions:  Continue same medications *If you need a refill on your cardiac medications before your next appointment, please call your pharmacy*   Lab Work: None ordered   Testing/Procedures: None ordered   Follow-Up: At [facility], you and your health needs are our priority.  As part of our continuing mission to provide you with exceptional heart care, we have created designated Provider Care Teams.  These Care Teams include your primary Cardiologist (physician) and Advanced Practice Providers (APPs -  Physician Assistants and Nurse Practitioners) who all work together to provide you with the care you need, when you need it.  We recommend signing up for the patient portal called "MyChart".  Sign up information is provided on this After Visit Summary.  MyChart is used to connect with patients for Virtual Visits (Telemedicine).  Patients are able to view lab/test results, encounter notes, upcoming appointments, etc.  Non-urgent messages can be sent to your provider as well.   To learn more about what you can do with MyChart, go to [email or web address].    Your next appointment:  1 year    Call in May to schedule August appointment     Provider:  Dr.Jordan

## 2022-11-09 ENCOUNTER — Other Ambulatory Visit (HOSPITAL_BASED_OUTPATIENT_CLINIC_OR_DEPARTMENT_OTHER): Payer: Self-pay

## 2022-11-09 MED ORDER — COVID-19 MRNA VAC-TRIS(PFIZER) 30 MCG/0.3ML IM SUSY
0.3000 mL | PREFILLED_SYRINGE | Freq: Once | INTRAMUSCULAR | 0 refills | Status: AC
Start: 2022-11-09 — End: 2022-11-10
  Filled 2022-11-09: qty 0.3, 1d supply, fill #0

## 2022-11-09 MED ORDER — INFLUENZA VAC A&B SURF ANT ADJ 0.5 ML IM SUSY
0.5000 mL | PREFILLED_SYRINGE | Freq: Once | INTRAMUSCULAR | 0 refills | Status: AC
  Filled 2022-11-09: qty 0.5, 1d supply, fill #0

## 2022-11-14 DIAGNOSIS — M25562 Pain in left knee: Secondary | ICD-10-CM | POA: Diagnosis not present

## 2022-12-05 DIAGNOSIS — K644 Residual hemorrhoidal skin tags: Secondary | ICD-10-CM | POA: Diagnosis not present

## 2022-12-05 DIAGNOSIS — L290 Pruritus ani: Secondary | ICD-10-CM | POA: Diagnosis not present

## 2023-01-23 ENCOUNTER — Encounter: Payer: Self-pay | Admitting: Diagnostic Neuroimaging

## 2023-01-23 ENCOUNTER — Ambulatory Visit: Payer: PPO | Admitting: Diagnostic Neuroimaging

## 2023-01-23 VITALS — BP 141/66 | HR 58 | Ht 69.0 in | Wt 187.0 lb

## 2023-01-23 DIAGNOSIS — R200 Anesthesia of skin: Secondary | ICD-10-CM

## 2023-01-23 DIAGNOSIS — R202 Paresthesia of skin: Secondary | ICD-10-CM

## 2023-01-23 NOTE — Progress Notes (Signed)
[facility]  PATIENT: Henry Patrick DOB: 1941-01-16  REFERRING CLINICIAN: Garlan Fillers, MD HISTORY FROM: patient  REASON FOR VISIT: new consult   HISTORICAL  CHIEF COMPLAINT:  Chief Complaint  Patient presents with   New Patient (Initial Visit)    Pt in 7 Pt states numbness, tingling, and burning in  both feet Pt states both legs hurt Pt states numbness in finger tips Pt states hard to pick up items     HISTORY OF PRESENT ILLNESS:   82 year old male here for evaluation of numbness in fingertips and feet.  Symptoms started about 2 years ago.  Has numbness, pain, tingling, burning sensation.  Has had lab testing by PCP and no specific causes been found.  Symptoms worsening over time.   REVIEW OF SYSTEMS: Full 14 system review of systems performed and negative with exception of: As per HPI.  ALLERGIES: No Known Allergies  HOME MEDICATIONS: Outpatient Medications Prior to Visit  Medication Sig Dispense Refill   acetaminophen (TYLENOL) 500 MG tablet Take 1,000 mg by mouth every 6 (six) hours as needed for mild pain.     aspirin EC 81 MG tablet Take 1 tablet (81 mg total) by mouth daily. 90 tablet 3   atorvastatin (LIPITOR) 40 MG tablet TAKE 1 TABLET BY MOUTH EVERY DAY 90 tablet 1   Fish Oil-Cholecalciferol (FISH OIL + D3) 1000-1000 MG-UNIT CAPS Take 1,000 mg by mouth daily.     HYDROcodone-acetaminophen (NORCO) 10-325 MG tablet take 0.5-1 tablet by oral route every 4 - 6 hours as needed for pain     Lysine 500 MG TABS Take 500 mg by mouth daily.     methocarbamol (ROBAXIN) 500 MG tablet Take 1 tablet (500 mg total) by mouth 4 (four) times daily. 90 tablet 1   metoprolol succinate (TOPROL-XL) 25 MG 24 hr tablet Take 25 mg by mouth daily. for blood pressure  0   Multiple Vitamin (MULTI-VITAMIN PO) Take 1 tablet by mouth daily.      naproxen sodium (ALEVE) 220 MG tablet Take 220 mg by mouth as needed.     olmesartan (BENICAR) 20 MG tablet Take 20 mg by  mouth daily.     Probiotic Product (ALIGN PO) Take 1 Capful by mouth daily.     Facility-Administered Medications Prior to Visit  Medication Dose Route Frequency Provider Last Rate Last Admin   sodium chloride flush (NS) 0.9 % injection 3 mL  3 mL Intravenous Q12H [place], Peter M, MD        PAST MEDICAL HISTORY: Past Medical History:  Diagnosis Date   Coronary artery disease    Atherosclerotic CAD   Dyslipidemia    History of dyslipidemia   History of arthritis    Hyperlipidemia    Obesity    Plantar fasciitis    Spinal stenosis     PAST SURGICAL HISTORY: Past Surgical History:  Procedure Laterality Date   CARDIAC CATHETERIZATION  08/02/2004   ejection fraction estimated 60%   CORONARY ARTERY BYPASS GRAFT  1999   x2 by Dr. [email or web address] [email or web address]   LEFT HEART CATH AND CORS/GRAFTS ANGIOGRAPHY N/A 08/10/2021   Procedure: LEFT HEART CATH AND CORS/GRAFTS ANGIOGRAPHY;  Surgeon: Sw[place]il[place], Peter M, MD;  Location: [facility] INVASIVE CV LAB;  Service: Cardiovascular;  Laterality: N/A;   LUMBAR LAMINECTOMY/DECOMPRESSION MICRODISCECTOMY N/A 04/06/2021   Procedure: OPEN LAMINECTOMY, LUMBAR FOUR-FIVE, LUMBAR FIVE-SACRAL ONE;  Surgeon: Bethann Goo, DO;  Location: [facility] OR;  Service: Neurosurgery;  Laterality: N/A;   TONSILLECTOMY  FAMILY HISTORY: Family History  Problem Relation Age of Onset   Heart attack Mother    Heart attack Father 36   Cirrhosis Sister    Hypertension Brother    Neuropathy Neg Hx     SOCIAL HISTORY: Social History   Socioeconomic History   Marital status: Married    Spouse name: Not on file   Number of children: Not on file   Years of education: Not on file   Highest education level: Not on file  Occupational History   Not on file  Tobacco Use   Smoking status: Never   Smokeless tobacco: Never  Vaping Use   Vaping status: Never Used  Substance and Sexual Activity   Alcohol use: No    Alcohol/week: 0.0 standard drinks of alcohol   Drug use: No   Sexual  activity: Not on file  Other Topics Concern   Not on file  Social History Narrative   Pt lives with wife    Retired    International aid/development worker of [employer] Strain: Not on file  Food Insecurity: Not on file  Transportation Needs: Not on file  Physical Activity: Not on file  Stress: Not on file  Social Connections: Not on file  Intimate Partner Violence: Not on file     PHYSICAL EXAM  GENERAL EXAM/CONSTITUTIONAL: Vitals:  Vitals:   01/23/23 0959  BP: (!) 141/66  Pulse: (!) 58  Weight: 187 lb (84.8 kg)  Height: 5\' 9"  (1.753 m)   Body mass index is 27.62 kg/m. Wt Readings from Last 3 Encounters:  01/23/23 187 lb (84.8 kg)  10/11/22 188 lb (85.3 kg)  02/17/22 195 lb (88.5 kg)   Patient is in no distress; well developed, nourished and groomed; neck is supple  CARDIOVASCULAR: Examination of carotid arteries is normal; no carotid bruits Regular rate and rhythm, no murmurs Examination of peripheral vascular system by observation and palpation is normal  EYES: Ophthalmoscopic exam of optic discs and posterior segments is normal; no papilledema or hemorrhages No results found.  MUSCULOSKELETAL: Gait, strength, tone, movements noted in Neurologic exam below  NEUROLOGIC: MENTAL STATUS:      No data to display         awake, alert, oriented to person, place and time recent and remote memory intact normal attention and concentration language fluent, comprehension intact, naming intact fund of knowledge appropriate  CRANIAL NERVE:  2nd - no papilledema on fundoscopic exam 2nd, 3rd, 4th, 6th - pupils equal and reactive to light, visual fields full to confrontation, extraocular muscles intact, no nystagmus 5th - facial sensation symmetric 7th - facial strength symmetric 8th - hearing intact 9th - palate elevates symmetrically, uvula midline 11th - shoulder shrug symmetric 12th - tongue protrusion midline  MOTOR:  normal bulk and tone, full  strength in the BUE, BLE  SENSORY:  normal and symmetric to light touch, temperature, vibration; EXCEPT DECR IN FINGERS AND FEET  COORDINATION:  finger-nose-finger, fine finger movements normal  REFLEXES:  deep tendon reflexes 1+ and symmetric  GAIT/STATION:  narrow based gait     DIAGNOSTIC DATA (LABS, IMAGING, TESTING) - I reviewed patient records, labs, notes, testing and imaging myself where available.  Lab Results  Component Value Date   WBC 7.3 02/17/2022   HGB 13.6 02/17/2022   HCT 41.0 02/17/2022   MCV 92 02/17/2022   PLT 186 02/17/2022      Component Value Date/Time   NA 139 02/17/2022 1546   K 4.3  02/17/2022 1546   CL 102 02/17/2022 1546   CO2 20 02/17/2022 1546   GLUCOSE 93 02/17/2022 1546   GLUCOSE 115 (H) 04/02/2021 0830   BUN 19 02/17/2022 1546   CREATININE 0.94 02/17/2022 1546   CALCIUM 9.4 02/17/2022 1546   GFRNONAA 56 (L) 04/02/2021 0830   GFRAA  04/23/2007 0810    >60        The eGFR has been calculated using the MDRD equation. This calculation has not been validated in all clinical   No results found for: "CHOL", "HDL", "LDLCALC", "LDLDIRECT", "TRIG", "CHOLHDL" No results found for: "HGBA1C" No results found for: "VITAMINB12" Lab Results  Component Value Date   TSH 2.120 02/17/2022       ASSESSMENT AND PLAN  82 y.o. year old male here with:   Dx:  1. Numbness and tingling     PLAN:  Neuropathy pain (in hands and feet; since ~2022) - check neuropathy labs - consider painful neuropathy treatment options: -duloxetine 30-60mg  daily, amitriptyline 25-50mg  at bedtime, gabapentin 100-300mg  three times a day, pregabalin 75-150mg  twice a day -capsaicin cream, lidocaine patch / cream, alpha-lipoic acid 600mg  daily  Orders Placed This Encounter  Procedures   Vitamin B12   Hemoglobin A1c   TSH Rfx on Abnormal to Free T4   Multiple Myeloma Panel (SPEP&IFE w/QIG)   ANA,IFA RA Diag Pnl w/rflx Tit/Patn   Return for pending if  symptoms worsen or fail to improve, pending test results.    Suanne Marker, MD 01/23/2023, 10:25 AM Certified in Neurology, Neurophysiology and Neuroimaging  [facility] [address] [place], [place] [address] [phone]

## 2023-01-23 NOTE — Patient Instructions (Signed)
  Neuropathy pain (in hands and feet; since ~2022) - check neuropathy labs - consider painful neuropathy treatment options: -duloxetine 30-60mg  daily, amitriptyline 25-50mg  at bedtime, gabapentin 100-300mg  three times a day, pregabalin 75-150mg  twice a day -capsaicin cream, lidocaine patch / cream, alpha-lipoic acid 600mg  daily

## 2023-01-27 ENCOUNTER — Other Ambulatory Visit: Payer: Self-pay | Admitting: Cardiology

## 2023-01-30 DIAGNOSIS — M545 Low back pain, unspecified: Secondary | ICD-10-CM | POA: Diagnosis not present

## 2023-01-31 LAB — MULTIPLE MYELOMA PANEL, SERUM
Albumin SerPl Elph-Mcnc: 3.5 g/dL (ref 2.9–4.4)
Albumin/Glob SerPl: 1.1 (ref 0.7–1.7)
Alpha 1: 0.2 g/dL (ref 0.0–0.4)
Alpha2 Glob SerPl Elph-Mcnc: 0.9 g/dL (ref 0.4–1.0)
B-Globulin SerPl Elph-Mcnc: 0.8 g/dL (ref 0.7–1.3)
Gamma Glob SerPl Elph-Mcnc: 1.3 g/dL (ref 0.4–1.8)
Globulin, Total: 3.2 g/dL (ref 2.2–3.9)
IgA/Immunoglobulin A, Serum: 32 mg/dL — ABNORMAL LOW (ref 61–437)
IgG (Immunoglobin G), Serum: 714 mg/dL (ref 603–1613)
IgM (Immunoglobulin M), Srm: 1090 mg/dL — ABNORMAL HIGH (ref 15–143)
Total Protein: 6.7 g/dL (ref 6.0–8.5)

## 2023-01-31 LAB — HEMOGLOBIN A1C
Est. average glucose Bld gHb Est-mCnc: 126 mg/dL
Hgb A1c MFr Bld: 6 % — ABNORMAL HIGH (ref 4.8–5.6)

## 2023-01-31 LAB — ANA,IFA RA DIAG PNL W/RFLX TIT/PATN
ANA Titer 1: NEGATIVE
Cyclic Citrullin Peptide Ab: 5 U (ref 0–19)
Rheumatoid fact SerPl-aCnc: <10 [IU]/mL (ref <14.0)

## 2023-01-31 LAB — TSH RFX ON ABNORMAL TO FREE T4: TSH: 1.94 u[IU]/mL (ref 0.450–4.500)

## 2023-01-31 LAB — VITAMIN B12: Vitamin B-12: 546 pg/mL (ref 232–1245)

## 2023-03-10 DIAGNOSIS — M5416 Radiculopathy, lumbar region: Secondary | ICD-10-CM | POA: Diagnosis not present

## 2023-03-10 DIAGNOSIS — I10 Essential (primary) hypertension: Secondary | ICD-10-CM | POA: Diagnosis not present

## 2023-03-10 DIAGNOSIS — R1031 Right lower quadrant pain: Secondary | ICD-10-CM | POA: Diagnosis not present

## 2023-03-10 DIAGNOSIS — K648 Other hemorrhoids: Secondary | ICD-10-CM | POA: Diagnosis not present

## 2023-03-10 NOTE — Progress Notes (Signed)
Unremarkable labs. Mild A1c and IgM abnl are not likely symptomatic. Continue current plan. -VRP

## 2023-03-27 DIAGNOSIS — Z6827 Body mass index (BMI) 27.0-27.9, adult: Secondary | ICD-10-CM | POA: Diagnosis not present

## 2023-03-27 DIAGNOSIS — M5416 Radiculopathy, lumbar region: Secondary | ICD-10-CM | POA: Diagnosis not present

## 2023-07-09 IMAGING — RF DG LUMBAR SPINE 1V
1 series · 1 of 1 positions shown · non-contrast
Comparison: 08/19/2020

CLINICAL DATA: Laminectomy L4-5 and L5-S1

EXAM:
LUMBAR SPINE - 1 VIEW

[Series 1: run · 1 of 1 slices shown]
[im 1/1]
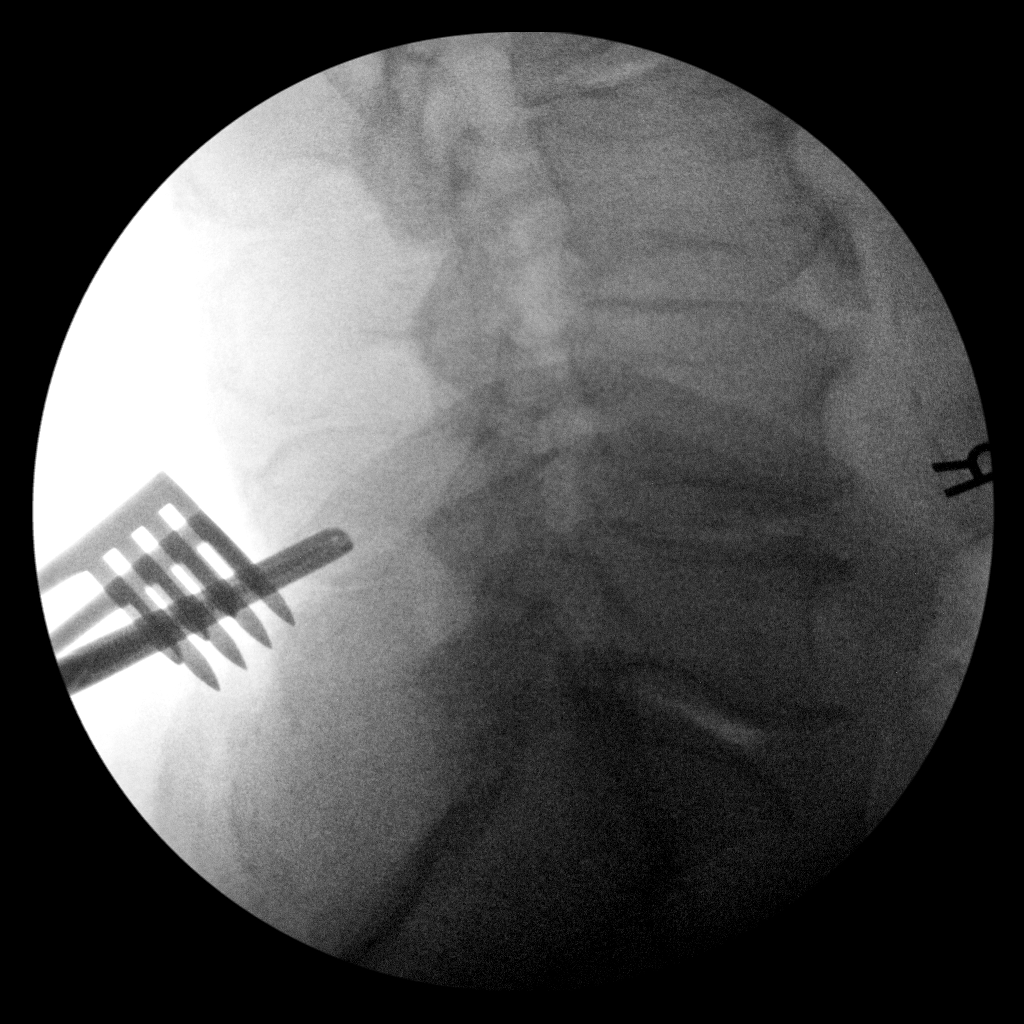

[1 of 1 positions shown; findings below may reference images not displayed]

FINDINGS: Single lateral C-arm image of the lumbar spine was obtained. There
is a clamp on the spinous process of L4.
IMPRESSION: Localization L4 spinous process.

## 2023-07-28 ENCOUNTER — Other Ambulatory Visit: Payer: Self-pay | Admitting: Internal Medicine

## 2023-07-28 DIAGNOSIS — I7143 Infrarenal abdominal aortic aneurysm, without rupture: Secondary | ICD-10-CM

## 2023-08-01 ENCOUNTER — Ambulatory Visit
Admission: RE | Admit: 2023-08-01 | Discharge: 2023-08-01 | Disposition: A | Discharge status: Home / Self Care | Source: Ambulatory Visit | Attending: Internal Medicine | Admitting: Internal Medicine

## 2023-08-01 DIAGNOSIS — Z136 Encounter for screening for cardiovascular disorders: Secondary | ICD-10-CM | POA: Diagnosis not present

## 2023-08-01 DIAGNOSIS — I7143 Infrarenal abdominal aortic aneurysm, without rupture: Secondary | ICD-10-CM

## 2023-08-01 DIAGNOSIS — E785 Hyperlipidemia, unspecified: Secondary | ICD-10-CM | POA: Diagnosis not present

## 2023-08-01 DIAGNOSIS — I4510 Unspecified right bundle-branch block: Secondary | ICD-10-CM | POA: Diagnosis not present

## 2023-08-15 ENCOUNTER — Other Ambulatory Visit: Payer: Self-pay | Admitting: Internal Medicine

## 2023-08-15 ENCOUNTER — Ambulatory Visit
Admission: RE | Admit: 2023-08-15 | Discharge: 2023-08-15 | Disposition: A | Discharge status: Home / Self Care | Source: Ambulatory Visit | Attending: Internal Medicine | Admitting: Internal Medicine

## 2023-08-15 DIAGNOSIS — I700 Atherosclerosis of aorta: Secondary | ICD-10-CM | POA: Diagnosis not present

## 2023-08-15 DIAGNOSIS — I7772 Dissection of iliac artery: Secondary | ICD-10-CM

## 2023-08-15 DIAGNOSIS — I745 Embolism and thrombosis of iliac artery: Secondary | ICD-10-CM | POA: Diagnosis not present

## 2023-08-15 MED ORDER — IOPAMIDOL (ISOVUE-370) INJECTION 76%
80.0000 mL | Freq: Once | INTRAVENOUS | Status: AC | PRN
  Administered 2023-08-15: 80 mL via INTRAVENOUS

## 2023-09-11 DIAGNOSIS — Z125 Encounter for screening for malignant neoplasm of prostate: Secondary | ICD-10-CM | POA: Diagnosis not present

## 2023-09-11 DIAGNOSIS — E785 Hyperlipidemia, unspecified: Secondary | ICD-10-CM | POA: Diagnosis not present

## 2023-09-11 DIAGNOSIS — I10 Essential (primary) hypertension: Secondary | ICD-10-CM | POA: Diagnosis not present

## 2023-09-11 DIAGNOSIS — D509 Iron deficiency anemia, unspecified: Secondary | ICD-10-CM | POA: Diagnosis not present

## 2023-09-19 DIAGNOSIS — R82998 Other abnormal findings in urine: Secondary | ICD-10-CM | POA: Diagnosis not present

## 2023-09-19 DIAGNOSIS — Z1331 Encounter for screening for depression: Secondary | ICD-10-CM | POA: Diagnosis not present

## 2023-09-19 DIAGNOSIS — I872 Venous insufficiency (chronic) (peripheral): Secondary | ICD-10-CM | POA: Diagnosis not present

## 2023-09-19 DIAGNOSIS — Z0000 Encounter for general adult medical examination without abnormal findings: Secondary | ICD-10-CM | POA: Diagnosis not present

## 2023-09-19 DIAGNOSIS — I2510 Atherosclerotic heart disease of native coronary artery without angina pectoris: Secondary | ICD-10-CM | POA: Diagnosis not present

## 2023-09-19 DIAGNOSIS — M5416 Radiculopathy, lumbar region: Secondary | ICD-10-CM | POA: Diagnosis not present

## 2023-09-19 DIAGNOSIS — E785 Hyperlipidemia, unspecified: Secondary | ICD-10-CM | POA: Diagnosis not present

## 2023-09-19 DIAGNOSIS — I745 Embolism and thrombosis of iliac artery: Secondary | ICD-10-CM | POA: Diagnosis not present

## 2023-09-19 DIAGNOSIS — Z1339 Encounter for screening examination for other mental health and behavioral disorders: Secondary | ICD-10-CM | POA: Diagnosis not present

## 2023-09-19 DIAGNOSIS — I10 Essential (primary) hypertension: Secondary | ICD-10-CM | POA: Diagnosis not present

## 2023-10-20 ENCOUNTER — Other Ambulatory Visit: Payer: Self-pay | Admitting: Cardiology

## 2023-10-30 NOTE — Progress Notes (Signed)
 Henry Patrick Date of Birth: [birth year] Medical Record #[id number]  History of Present Illness: [name] is seen today for follow up CAD. He has a history of coronary disease and is status post CABG in 1999. This included an LIMA graft to the LAD and a saphenous vein graft to the obtuse marginal vessel. Cath in 2006 showed grafts were patent. Myoview  study in April 2012 was normal. Repeat Myoview  in Dec 2022 was normal. He also has a history of dyslipidemia and obesity.    He has continued problems with his spinal stenosis. His major complaint is of leg and back pain. He does have spinal stenosis and neuropathy. He did have L4-S1 laminectomy in February 2023.    Last year he complained of a lot of fatigue with exertion and SOB.   Because of these symptoms he underwent repeat cardiac cath. This showed no significant change compared to 2006 with patent LIMA to the LAD, SVG to OM3. Normal EDP and EF 50-55%.   On follow up today he is doing ok. 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 He is caring for his wife who had colon surgery. He does get tired after 10 minutes of mowing but attributes this mainly to his back.   Current Outpatient Medications on File Prior to Visit  Medication Sig Dispense Refill   gabapentin  (NEURONTIN ) 300 MG capsule Take 300 mg by mouth as needed.     Lactase (LACTAID FAST ACT) 9000 units TABS 1 tab as needed     methocarbamol  (ROBAXIN ) 500 MG tablet Take 1 tablet (500 mg total) by mouth 4 (four) times daily. (Patient taking differently: Take 500 mg by mouth as needed.) 90 tablet 1   acetaminophen  (TYLENOL ) 500 MG tablet Take 1,000 mg by mouth every 6 (six) hours as needed for mild pain.     aspirin  EC 81 MG tablet Take 1 tablet (81 mg total) by mouth daily. 90 tablet 3   atorvastatin  (LIPITOR) 40 MG tablet TAKE 1 TABLET BY MOUTH EVERY DAY 90 tablet 0    Fish Oil-Cholecalciferol (FISH OIL + D3) 1000-1000 MG-UNIT CAPS Take 1,000 mg by mouth daily.     HYDROcodone -acetaminophen  (NORCO) 10-325 MG tablet take 0.5-1 tablet by oral route every 4 - 6 hours as needed for pain     Lysine 500 MG TABS Take 500 mg by mouth daily.     metoprolol  succinate (TOPROL -XL) 25 MG 24 hr tablet Take 25 mg by mouth daily. for blood pressure  0   Multiple Vitamin (MULTI-VITAMIN PO) Take 1 tablet by mouth daily.      naproxen sodium (ALEVE) 220 MG tablet Take 220 mg by mouth as needed.     olmesartan (BENICAR) 20 MG tablet Take 20 mg by mouth daily.     Probiotic Product (ALIGN PO) Take 1 Capful by mouth daily.     Current Facility-Administered Medications on File Prior to Visit  Medication Dose Route Frequency Provider Last Rate Last Admin   sodium chloride  flush (NS) 0.9 % injection 3 mL  3 mL Intravenous Q12H [place], Andelyn Spade M, MD        No Known Allergies  Past Medical History:  Diagnosis Date   Coronary artery disease    Atherosclerotic CAD   Dyslipidemia    History of dyslipidemia   History of arthritis    Hyperlipidemia  Obesity    Plantar fasciitis    Spinal stenosis     Past Surgical History:  Procedure Laterality Date   CARDIAC CATHETERIZATION  08/02/2004   ejection fraction estimated 60%   CORONARY ARTERY BYPASS GRAFT  02/14/1997   x2 by Dr. Lucas [email or web address]   LEFT HEART CATH AND CORS/GRAFTS ANGIOGRAPHY N/A 08/10/2021   Procedure: LEFT HEART CATH AND CORS/GRAFTS ANGIOGRAPHY;  Surgeon: [place], Nikyah Lackman M, MD;  Location: [facility] INVASIVE CV LAB;  Service: Cardiovascular;  Laterality: N/A;   LUMBAR LAMINECTOMY/DECOMPRESSION MICRODISCECTOMY N/A 04/06/2021   Procedure: OPEN LAMINECTOMY, LUMBAR FOUR-FIVE, LUMBAR FIVE-SACRAL ONE;  Surgeon: Carollee Lani BROCKS, DO;  Location: [facility] OR;  Service: Neurosurgery;  Laterality: N/A;   TONSILLECTOMY      Social History   Tobacco Use  Smoking Status Never  Smokeless Tobacco Never    Social History    Substance and Sexual Activity  Alcohol  Use No   Alcohol /week: 0.0 standard drinks of alcohol     Family History  Problem Relation Age of Onset   Heart attack Mother    Heart attack Father 70   Cirrhosis Sister    Hypertension Brother    Neuropathy Neg Hx     Review of Systems: As noted in HPI.  All other systems were reviewed and are negative.  Physical Exam: BP 114/66   Pulse (!) 56   Ht 5' 9 (1.753 m)   Wt 184 lb 3.2 oz (83.6 kg)   SpO2 97%   BMI 27.20 kg/m  GENERAL:  Well appearing obese WM in NAD HEENT:  PERRL, EOMI, sclera are clear. Oropharynx is clear. NECK:  No jugular venous distention, carotid upstroke brisk and symmetric, no bruits, no thyromegaly or adenopathy LUNGS:  Clear to auscultation bilaterally CHEST:  Unremarkable HEART:  RRR,  PMI not displaced or sustained,S1 and S2 within normal limits, no S3, no S4: no clicks, no rubs, no murmurs ABD:  Soft, nontender. BS +, no masses or bruits. No hepatomegaly, no splenomegaly EXT:  2 + pulses throughout, no edema, no cyanosis no clubbing SKIN:  Warm and dry.  No rashes NEURO:  Alert and oriented x 3. Cranial nerves II through XII intact. PSYCH:  Cognitively intact      LABORATORY DATA:  Labs dated 12/22/15: cholesterol 139, triglycerides 121, HDL 37, LDL 78. CBC, chemistries and TSH normal. Dated 12/28/16: cholesterol 151, triglycerides 147, HDL 39, LDL 83. Chemistries and hgb normal.  Dated 01/15/18: cholesterol 140, triglycerides 124, HDL 38, LDL 77.  Dated 02/21/18: creatinine 1.15.  Dated 04/25/18: normal Hgb.  Dated 02/04/20: cholesterol 144, triglycerides 139, HDL 37, LDL 81. CMET and TSH normal. Dated 06/24/21: cholesterol 139, triglycerides 146, HDL 36, LDL 74. CMET normal.  Dated 09/05/32: Apo B 63. Unable to view other lab results.  Dated 09/11/23: cholesterol 122, triglycerides 111, HDL 42, LDL 58. CMET normal.   EKG Interpretation Date/Time:  Wednesday November 08 2023 08:34:32 EDT Ventricular  Rate:  56 PR Interval:  392 QRS Duration:  122 QT Interval:  460 QTC Calculation: 443 R Axis:   13  Text Interpretation: Sinus bradycardia with 1st degree A-V block Right bundle branch block When compared with ECG of 11-Oct-2022 08:30, Sinus rhythm is no longer with 2nd degree A-V block (Mobitz I) Confirmed by [place], Sumiko Ceasar [phone]) on 11/08/2023 8:41:23 AM    Myoview  01/28/21: Study Highlights      The study is normal. Findings are consistent with no prior ischemia. The study is low risk.   No ST deviation  was noted.   Left ventricular function is normal. Nuclear stress EF: 61 %. The left ventricular ejection fraction is normal (55-65%). End diastolic cavity size is normal.   Prior study available for comparison from 08/21/2015.    Normal LE arterial dopplers in Nov 2021  Cardiac cath 08/10/21:  LEFT HEART CATH AND CORS/GRAFTS ANGIOGRAPHY   Conclusion      Prox RCA lesion is 30% stenosed.   Ost LM to Mid LM lesion is 40% stenosed.   Prox LAD lesion is 90% stenosed.   Mid LAD lesion is 100% stenosed.   Prox Cx to Dist Cx lesion is 80% stenosed.   Mid Graft lesion is 30% stenosed.   SVG graft was visualized by angiography and is normal in caliber.   LIMA graft was visualized by angiography and is normal in caliber.   The graft exhibits no disease.   There is mild left ventricular systolic dysfunction.   LV end diastolic pressure is normal.   The left ventricular ejection fraction is 50-55% by visual estimate.   2 vessel obstructive CAD Patent LIMA to the LAD Patent SVG to OM3 Mild LV dysfunction. EF 50-55% Normal LVEDP   Plan: no change compared to prior cardiac cath in 2006. Continue medical management.    Coronary Diagrams  Diagnostic Dominance: Right  Intervention   Assessment / Plan: 1. CAD s/p CABG in 1999. Normal Myoview  in Dec 2022. Cardiac cath June 2023 showed good patency of bypass grafts and no other new obstruction. Continue medical therapy. Follow up in  one  year.  2. Hyperlipidemia. LDL 58. . Continue statin.   3. RBBB chronic.  Benign. 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 Today just has first degree AV block with long PR interval. No symptoms.   4. Family history of AAA. 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 Seen by Dr Serene and no intervention needed.   5. Neuropathic leg/feet pain.

## 2023-11-06 ENCOUNTER — Encounter: Payer: Self-pay | Admitting: Surgery

## 2023-11-06 ENCOUNTER — Ambulatory Visit: Attending: Surgery | Admitting: Surgery

## 2023-11-06 VITALS — BP 134/79 | HR 57 | Temp 97.8°F | Ht 69.0 in | Wt 185.0 lb

## 2023-11-06 DIAGNOSIS — I7772 Dissection of iliac artery: Secondary | ICD-10-CM

## 2023-11-06 NOTE — Progress Notes (Signed)
 [facility]  Patient name: Henry Patrick MRN: [id number] DOB: [birth year] Sex: male   REQUESTING PROVIDER:    Dr. Jakie   REASON FOR CONSULT:    CIA dissection  HISTORY OF PRESENT ILLNESS:   Henry Patrick is a 83 y.o. male, who is referred for a left common iliac artery dissection.  This was initially detected on ultrasound for abdominal aneurysm and further evaluated with a CT scan.  He denies claudication symptoms.  He does have some tingling in his feet as well as leg swelling.  He gets cramps at night and his legs   He is a non-smoker.  He takes a statin for hypercholesterolemia.  He has a history of coronary artery disease, status post CABG  PAST MEDICAL HISTORY    Past Medical History:  Diagnosis Date   Coronary artery disease    Atherosclerotic CAD   Dyslipidemia    History of dyslipidemia   History of arthritis    Hyperlipidemia    Obesity    Plantar fasciitis    Spinal stenosis      FAMILY HISTORY   Family History  Problem Relation Age of Onset   Heart attack Mother    Heart attack Father 8   Cirrhosis Sister    Hypertension Brother    Neuropathy Neg Hx     SOCIAL HISTORY:   Social History   Socioeconomic History   Marital status: Married    Spouse name: Not on file   Number of children: Not on file   Years of education: Not on file   Highest education level: Not on file  Occupational History   Not on file  Tobacco Use   Smoking status: Never   Smokeless tobacco: Never  Vaping Use   Vaping status: Never Used  Substance and Sexual Activity   Alcohol  use: No    Alcohol /week: 0.0 standard drinks of alcohol    Drug use: No   Sexual activity: Not on file  Other Topics Concern   Not on file  Social History Narrative   Pt lives with wife    Retired    [employer]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     aspirin  EC 81 MG tablet Take 1 tablet (81 mg total) by mouth daily. 90 tablet 3   atorvastatin  (LIPITOR) 40 MG tablet TAKE 1 TABLET BY MOUTH EVERY DAY 90 tablet 0   Fish Oil-Cholecalciferol (FISH OIL + D3) 1000-1000 MG-UNIT CAPS Take 1,000 mg by mouth daily.     HYDROcodone -acetaminophen  (NORCO) 10-325 MG tablet take 0.5-1 tablet by oral route every 4 - 6 hours as needed for pain     Lysine 500 MG TABS Take 500 mg by mouth daily.     methocarbamol  (ROBAXIN ) 500 MG tablet Take 1 tablet (500 mg total) by mouth 4 (four) times daily. 90 tablet 1   metoprolol  succinate (TOPROL -XL) 25 MG 24 hr tablet Take 25 mg by mouth daily. for blood pressure  0   Multiple Vitamin (MULTI-VITAMIN PO) Take 1 tablet by mouth daily.      naproxen sodium (ALEVE) 220 MG tablet Take 220 mg by mouth as needed.     olmesartan (BENICAR) 20 MG tablet Take 20 mg by mouth daily.     Probiotic Product (ALIGN PO) Take 1 Capful by mouth daily.     Current Facility-Administered Medications  Medication Dose Route Frequency Provider Last Rate Last Admin   sodium chloride  flush (NS) 0.9 % injection 3 mL  3 mL Intravenous Q12H [place], Peter M, MD        REVIEW OF SYSTEMS:   [X]  denotes positive finding, [ ]  denotes negative finding Cardiac  Comments:  Chest pain or chest pressure:    Shortness of breath upon exertion:    Short of breath when lying flat:    Irregular heart rhythm:        Vascular    Pain in calf, thigh, or hip brought on by ambulation:    Pain in feet at night that wakes you up from your sleep:     Blood clot in your  veins:    Leg swelling:         Pulmonary    Oxygen at home:    Productive cough:     Wheezing:         Neurologic    Sudden weakness in arms or legs:     Sudden numbness in arms or legs:     Sudden onset of difficulty speaking or slurred speech:    Temporary loss of vision in one eye:     Problems with dizziness:         Gastrointestinal    Blood in stool:      Vomited blood:         Genitourinary    Burning when urinating:     Blood in urine:        Psychiatric    Major depression:         Hematologic    Bleeding problems:    Problems with blood clotting too easily:        Skin    Rashes or ulcers:        Constitutional    Fever or chills:     PHYSICAL EXAM:   Vitals:   11/06/23 1042  BP: 134/79  Pulse: (!) 57  Temp: 97.8 F (36.6 C)  SpO2: 97%  Weight: 185 lb (83.9 kg)  Height: 5' 9 (1.753 m)    GENERAL: The patient is a well-nourished male, in no acute distress. The vital signs are documented above. CARDIAC: There is a regular rate and rhythm.  VASCULAR: Palpable pedal pulses bilaterally.  Bilateral lower extremity edema PULMONARY: Nonlabored respirations ABDOMEN: Soft and non-tender with normal pitched bowel sounds.  MUSCULOSKELETAL: There are no major deformities or cyanosis. NEUROLOGIC: No focal weakness or paresthesias are detected. SKIN: There are no ulcers or rashes noted. PSYCHIATRIC: The patient has a normal affect.  STUDIES:   I have reviewed the following CTA: 1. Positive for a short segment mostly thrombosed appearing Dissection of the Left Common Iliac Artery, corresponding to ultrasound finding.   2. Aortoiliac calcified atherosclerosis. Stable tortuosity of the abdominal aorta without discrete AAA.   3. No acute or inflammatory process identified in the abdomen or pelvis. Small gastric hiatal hernia. Diverticulosis of the small and large bowel without active inflammation. Chronic spine degeneration.   ASSESSMENT and PLAN    Left  common iliac dissection: The patient does not have any claudication symptoms.  There is only mild stenosis from this thrombosed dissection and no significant aneurysmal change.  No intervention is recommended at this time.  I will continue to monitor this.  I would consider intervention if he develops a significant stenosis and has associated claudication symptoms, or if it became aneurysmal.  He will get a repeat ultrasound and ABIs in 1 year  I suspect his leg swelling is related to venous insufficiency.  He has a history of saphenous harvest from his CABG on the left leg.  At this time the best recommendation would be leg elevation and compression stockings.   Malvina Serene CLORE, MD, FACS [facility] [phone] Pager [phone]

## 2023-11-08 ENCOUNTER — Ambulatory Visit: Attending: Cardiology | Admitting: Cardiology

## 2023-11-08 ENCOUNTER — Encounter: Payer: Self-pay | Admitting: Cardiology

## 2023-11-08 VITALS — BP 114/66 | HR 56 | Ht 69.0 in | Wt 184.2 lb

## 2023-11-08 DIAGNOSIS — I4510 Unspecified right bundle-branch block: Secondary | ICD-10-CM

## 2023-11-08 DIAGNOSIS — I10 Essential (primary) hypertension: Secondary | ICD-10-CM | POA: Diagnosis not present

## 2023-11-08 DIAGNOSIS — Z951 Presence of aortocoronary bypass graft: Secondary | ICD-10-CM | POA: Diagnosis not present

## 2023-11-08 DIAGNOSIS — I25708 Atherosclerosis of coronary artery bypass graft(s), unspecified, with other forms of angina pectoris: Secondary | ICD-10-CM | POA: Diagnosis not present

## 2023-11-08 NOTE — Patient Instructions (Signed)
 Medication Instructions:  Continue same medications *If you need a refill on your cardiac medications before your next appointment, please call your pharmacy*  Lab Work: None ordered  Testing/Procedures: None ordered  Follow-Up: At [facility], you and your health needs are our priority.  As part of our continuing mission to provide you with exceptional heart care, our providers are all part of one team.  This team includes your primary Cardiologist (physician) and Advanced Practice Providers or APPs (Physician Assistants and Nurse Practitioners) who all work together to provide you with the care you need, when you need it.  Your next appointment:  1 year   Call in May to schedule Sept appointment      Provider:  Dr.Jordan   We recommend signing up for the patient portal called MyChart.  Sign up information is provided on this After Visit Summary.  MyChart is used to connect with patients for Virtual Visits (Telemedicine).  Patients are able to view lab/test results, encounter notes, upcoming appointments, etc.  Non-urgent messages can be sent to your provider as well.   To learn more about what you can do with MyChart, go to [email or web address].

## 2023-11-14 DIAGNOSIS — Z23 Encounter for immunization: Secondary | ICD-10-CM | POA: Diagnosis not present

## 2024-01-14 ENCOUNTER — Other Ambulatory Visit: Payer: Self-pay | Admitting: Cardiology
